# Patient Record
Sex: Female | Born: 1960 | Race: White | Hispanic: No | Marital: Married | Smoking: Never smoker
Health system: Southern US, Community
[De-identification: ages and names within clinical notes are randomized; demographics above are authoritative.]

## PROBLEM LIST (undated history)

## (undated) DIAGNOSIS — D0591 Unspecified type of carcinoma in situ of right breast: Secondary | ICD-10-CM

## (undated) DIAGNOSIS — T753XXA Motion sickness, initial encounter: Secondary | ICD-10-CM

## (undated) DIAGNOSIS — M1990 Unspecified osteoarthritis, unspecified site: Secondary | ICD-10-CM

## (undated) DIAGNOSIS — N200 Calculus of kidney: Secondary | ICD-10-CM

## (undated) DIAGNOSIS — U071 COVID-19: Secondary | ICD-10-CM

## (undated) DIAGNOSIS — C439 Malignant melanoma of skin, unspecified: Secondary | ICD-10-CM

## (undated) DIAGNOSIS — Z923 Personal history of irradiation: Secondary | ICD-10-CM

## (undated) DIAGNOSIS — C50919 Malignant neoplasm of unspecified site of unspecified female breast: Secondary | ICD-10-CM

## (undated) DIAGNOSIS — Z973 Presence of spectacles and contact lenses: Secondary | ICD-10-CM

## (undated) HISTORY — PX: BLADDER SUSPENSION: SHX72

## (undated) HISTORY — DX: Calculus of kidney: N20.0

## (undated) HISTORY — PX: TONSILLECTOMY: SUR1361

## (undated) HISTORY — DX: COVID-19: U07.1

## (undated) HISTORY — PX: NM PET DX MELANOMA: HXRAD61

## (undated) HISTORY — DX: Unspecified type of carcinoma in situ of right breast: D05.91

## (undated) HISTORY — DX: Malignant melanoma of skin, unspecified: C43.9

---

## 1992-04-24 HISTORY — PX: ABDOMINAL HYSTERECTOMY: SHX81

## 1999-04-25 HISTORY — PX: BREAST SURGERY: SHX581

## 1999-04-25 HISTORY — PX: BREAST BIOPSY: SHX20

## 2009-02-22 DIAGNOSIS — D0591 Unspecified type of carcinoma in situ of right breast: Secondary | ICD-10-CM

## 2009-02-22 HISTORY — DX: Unspecified type of carcinoma in situ of right breast: D05.91

## 2009-04-24 DIAGNOSIS — C50919 Malignant neoplasm of unspecified site of unspecified female breast: Secondary | ICD-10-CM

## 2009-04-24 HISTORY — PX: BREAST LUMPECTOMY: SHX2

## 2009-04-24 HISTORY — DX: Malignant neoplasm of unspecified site of unspecified female breast: C50.919

## 2009-04-24 HISTORY — PX: BREAST EXCISIONAL BIOPSY: SUR124

## 2015-02-22 ENCOUNTER — Ambulatory Visit (INDEPENDENT_AMBULATORY_CARE_PROVIDER_SITE_OTHER): Payer: PRIVATE HEALTH INSURANCE | Admitting: Family Medicine

## 2015-02-22 ENCOUNTER — Encounter: Payer: Self-pay | Admitting: Family Medicine

## 2015-02-22 VITALS — BP 120/72 | HR 71 | Resp 16 | Ht 68.0 in | Wt 183.2 lb

## 2015-02-22 DIAGNOSIS — Z0000 Encounter for general adult medical examination without abnormal findings: Secondary | ICD-10-CM

## 2015-02-22 DIAGNOSIS — R002 Palpitations: Secondary | ICD-10-CM | POA: Insufficient documentation

## 2015-02-22 DIAGNOSIS — Z853 Personal history of malignant neoplasm of breast: Secondary | ICD-10-CM | POA: Diagnosis not present

## 2015-02-22 DIAGNOSIS — Z7189 Other specified counseling: Secondary | ICD-10-CM | POA: Diagnosis not present

## 2015-02-22 DIAGNOSIS — M722 Plantar fascial fibromatosis: Secondary | ICD-10-CM | POA: Diagnosis not present

## 2015-02-22 DIAGNOSIS — Z23 Encounter for immunization: Secondary | ICD-10-CM | POA: Diagnosis not present

## 2015-02-22 DIAGNOSIS — Z7689 Persons encountering health services in other specified circumstances: Secondary | ICD-10-CM

## 2015-02-22 MED ORDER — MELOXICAM 15 MG PO TABS: 15.0000 mg | ORAL_TABLET | Freq: Every day | ORAL | Status: DC

## 2015-02-22 NOTE — Patient Instructions (Signed)
Ice R foot 3-4 times a day.  Stretch R foot before walking.

## 2015-02-22 NOTE — Progress Notes (Signed)
Name: Alyssa Bradford   MRN: [id number]    DOB: 1960/08/13   Date:02/22/2015       Progress Note  Subjective  Chief Complaint  Chief Complaint  Patient presents with  . Establish Care  . Foot Injury    heel spt on Left foot   . Palpitations    Feels like heart jumping x few months maybe every other month.     HPI  Here to establish care.  Has hx of R breast cancer .  Survivor x 6 yrs.  Surgeon retired and oncologist released her.  She has not had a mammogram in about 1 year.  No masses felt.  R. breast  Still sensitive sec. to radiation.    She c/o some palpitations at times.  Short duration.  Lasts 5-8 sec.  No syncope or near syncope.  Strong fm hx of card disease (father with 2 MIs and multiple angioplasties and  Bypass graft.).  Needs a colonoscopy.   No labs in 1 year.  Had bone density test 1 year ago.  Has painful place  place on her R heel.   No problem-specific assessment & plan notes found for this encounter.   Past Medical History  Diagnosis Date  . Cancer [facility])     Past Surgical History  Procedure Laterality Date  . Breast surgery  2001    BIOPSY  . Abdominal hysterectomy    . Tonsillectomy    . Breast lumpectomy  2011  . Nm pet dx melanoma      Family History  Problem Relation Age of Onset  . Cancer Father     BLADDER /SKIN   . Heart disease Father     Social History   Social History  . Marital Status: Married    Spouse Name: N/A  . Number of Children: N/A  . Years of Education: N/A   Occupational History  . Not on file.   Social History Main Topics  . Smoking status: Never Smoker   . Smokeless tobacco: Never Used  . Alcohol Use: No  . Drug Use: No  . Sexual Activity: Not on file   Other Topics Concern  . Not on file   Social History Narrative  . No narrative on file     Current outpatient prescriptions:  .  meloxicam (MOBIC) 15 MG tablet, Take 1 tablet (15 mg total) by mouth daily., Disp: 30 tablet, Rfl: 6  Allergies  Allergen  Reactions  . Latex      Review of Systems  Constitutional: Negative for fever, chills, weight loss and malaise/fatigue.  HENT: Negative for congestion, hearing loss and nosebleeds.   Eyes: Negative for blurred vision, double vision and pain.  Respiratory: Negative for cough, sputum production, shortness of breath and wheezing.   Cardiovascular: Positive for palpitations. Negative for chest pain and leg swelling.  Gastrointestinal: Negative for heartburn, abdominal pain, diarrhea, constipation and blood in stool.  Genitourinary: Negative for dysuria, urgency and frequency.  Musculoskeletal: Negative for myalgias and joint pain.       R heel pain  Skin: Negative for rash.  Neurological: Negative for dizziness, tremors, sensory change, focal weakness, weakness and headaches.  Psychiatric/Behavioral: Negative for depression. The patient is not nervous/anxious and does not have insomnia.       Objective  Filed Vitals:   02/22/15 1432  BP: 120/72  Pulse: 71  Resp: 16  Height: 5\' 8"  (1.727 m)  Weight: 183 lb 3.2 oz (83.099 kg)  Physical Exam  Constitutional: She is oriented to person, place, and time and well-developed, well-nourished, and in no distress. No distress.  HENT:  Head: Normocephalic and atraumatic.  Eyes: Conjunctivae and EOM are normal. Pupils are equal, round, and reactive to light. No scleral icterus.  Neck: Normal range of motion. Carotid bruit is not present. No thyromegaly present.  Cardiovascular: Normal rate, regular rhythm, normal heart sounds and intact distal pulses.  Exam reveals no gallop and no friction rub.   No murmur heard. Pulmonary/Chest: Effort normal and breath sounds normal. No respiratory distress. She has no wheezes. She has no rales.  Abdominal: Soft. Bowel sounds are normal. She exhibits no distension and no mass. There is no tenderness.  Musculoskeletal: She exhibits no edema.  Tender spot on R foot plantar surface.  No skin lesions   Lymphadenopathy:    She has no cervical adenopathy.  Neurological: She is alert and oriented to person, place, and time.  Vitals reviewed.      No results found for this or any previous visit (from the past 2160 hour(s)).   Assessment & Plan  Problem List Items Addressed This Visit      Other   History of breast cancer   Relevant Orders   MM Digital Diagnostic Bilat   Ambulatory referral to General Surgery   Preventative health care   Relevant Orders   Varicella-zoster vaccine subcutaneous (Completed)   Palpitation   Relevant Orders   EKG 12-Lead   Ambulatory referral to Cardiology   TSH   Encounter to establish care - Primary   Relevant Orders   Comprehensive Metabolic Panel (CMET)   CBC with Differential   Lipid Profile    Other Visit Diagnoses    Plantar fasciitis        Relevant Medications    meloxicam (MOBIC) 15 MG tablet       Meds ordered this encounter  Medications  . meloxicam (MOBIC) 15 MG tablet    Sig: Take 1 tablet (15 mg total) by mouth daily.    Dispense:  30 tablet    Refill:  6   1. Encounter to establish care  - Comprehensive Metabolic Panel (CMET) - CBC with Differential - Lipid Profile  2. History of breast cancer  - MM Digital Diagnostic Bilat; Future- December - Ambulatory referral to General Surgery- January 3. Preventative health care  - Varicella-zoster vaccine subcutaneous  4. Palpitation  - EKG 12-Lead - Ambulatory referral to Cardiology - TSH  5. Plantar fasciitis  - meloxicam (MOBIC) 15 MG tablet; Take 1 tablet (15 mg total) by mouth daily.  Dispense: 30 tablet; Refill: 6

## 2015-02-23 ENCOUNTER — Telehealth: Payer: Self-pay | Admitting: *Deleted

## 2015-02-23 NOTE — Telephone Encounter (Signed)
Patient cardiology appt is Apr 27, 2015 at 2:40 with Dr. Rockey Situ.

## 2015-02-25 LAB — CBC WITH DIFFERENTIAL/PLATELET
BASOS ABS: 0.1 10*3/uL (ref 0.0–0.2)
Basos: 1 %
EOS (ABSOLUTE): 0.2 10*3/uL (ref 0.0–0.4)
Eos: 4 %
HEMOGLOBIN: 12.9 g/dL (ref 11.1–15.9)
Hematocrit: 36.7 % (ref 34.0–46.6)
IMMATURE GRANS (ABS): 0 10*3/uL (ref 0.0–0.1)
Immature Granulocytes: 0 %
Lymphocytes Absolute: 1.2 10*3/uL (ref 0.7–3.1)
Lymphs: 30 %
MCH: 31 pg (ref 26.6–33.0)
MCHC: 35.1 g/dL (ref 31.5–35.7)
MCV: 88 fL (ref 79–97)
MONOCYTES: 13 %
Monocytes Absolute: 0.5 10*3/uL (ref 0.1–0.9)
Neutrophils Absolute: 2.2 10*3/uL (ref 1.4–7.0)
Neutrophils: 52 %
PLATELETS: 229 10*3/uL (ref 150–379)
RBC: 4.16 x10E6/uL (ref 3.77–5.28)
RDW: 12.8 % (ref 12.3–15.4)
WBC: 4.2 10*3/uL (ref 3.4–10.8)

## 2015-02-26 LAB — LIPID PANEL
CHOLESTEROL TOTAL: 178 mg/dL (ref 100–199)
Chol/HDL Ratio: 2.7 ratio units (ref 0.0–4.4)
HDL: 66 mg/dL (ref >39)
LDL Calculated: 100 mg/dL — ABNORMAL HIGH (ref 0–99)
TRIGLYCERIDES: 61 mg/dL (ref 0–149)
VLDL CHOLESTEROL CAL: 12 mg/dL (ref 5–40)

## 2015-02-26 LAB — COMPREHENSIVE METABOLIC PANEL
ALK PHOS: 57 IU/L (ref 39–117)
ALT: 8 IU/L (ref 0–32)
AST: 27 IU/L (ref 0–40)
Albumin/Globulin Ratio: 1.8 (ref 1.1–2.5)
Albumin: 4.2 g/dL (ref 3.5–5.5)
BILIRUBIN TOTAL: 0.3 mg/dL (ref 0.0–1.2)
BUN/Creatinine Ratio: 19 (ref 9–23)
BUN: 16 mg/dL (ref 6–24)
CHLORIDE: 101 mmol/L (ref 97–106)
CO2: 25 mmol/L (ref 18–29)
Calcium: 9.1 mg/dL (ref 8.7–10.2)
Creatinine, Ser: 0.83 mg/dL (ref 0.57–1.00)
GFR calc Af Amer: 92 mL/min/{1.73_m2} (ref >59)
GFR calc non Af Amer: 80 mL/min/{1.73_m2} (ref >59)
GLUCOSE: 86 mg/dL (ref 65–99)
Globulin, Total: 2.3 g/dL (ref 1.5–4.5)
Potassium: 4.3 mmol/L (ref 3.5–5.2)
Sodium: 142 mmol/L (ref 136–144)
TOTAL PROTEIN: 6.5 g/dL (ref 6.0–8.5)

## 2015-02-26 LAB — TSH: TSH: 2.87 u[IU]/mL (ref 0.450–4.500)

## 2015-03-01 ENCOUNTER — Telehealth: Payer: Self-pay | Admitting: *Deleted

## 2015-03-01 NOTE — Telephone Encounter (Signed)
Called patient and asked if she had scheduled her mammogram yet? Per patient she has to get film from [place] and this will be done at the end of month. Patient will call office and let us know when mammogram is scheduled and we will then proceed with surgical referral to Dr. Jamal Collin.

## 2015-04-27 ENCOUNTER — Encounter: Payer: Self-pay | Admitting: Cardiovascular Disease

## 2015-04-27 ENCOUNTER — Encounter (INDEPENDENT_AMBULATORY_CARE_PROVIDER_SITE_OTHER): Payer: Self-pay

## 2015-04-27 ENCOUNTER — Ambulatory Visit (INDEPENDENT_AMBULATORY_CARE_PROVIDER_SITE_OTHER): Payer: Managed Care, Other (non HMO) | Admitting: Cardiovascular Disease

## 2015-04-27 VITALS — BP 124/82 | HR 78 | Ht 68.0 in | Wt 183.0 lb

## 2015-04-27 DIAGNOSIS — Z0000 Encounter for general adult medical examination without abnormal findings: Secondary | ICD-10-CM | POA: Diagnosis not present

## 2015-04-27 DIAGNOSIS — Z853 Personal history of malignant neoplasm of breast: Secondary | ICD-10-CM | POA: Diagnosis not present

## 2015-04-27 DIAGNOSIS — R002 Palpitations: Secondary | ICD-10-CM | POA: Diagnosis not present

## 2015-04-27 NOTE — Progress Notes (Signed)
Patient ID: Alyssa Bradford, female    DOB: 1960/06/08, 55 y.o.   MRN: [id number]  HPI Comments: Alyssa Bradford is a very pleasant 55 year old woman with history of tachycardia, strong family history of coronary artery disease who presents for evaluation of her arrhythmia, patient of Dr. Luan Pulling History of breast cancer on the right with radiation  She reports for the past 3 years he has had one episode per month of racing in her chest, described as a tachycardia, lasting 20 seconds or so. Typically resolves by sitting, resting. Never lasted very long, no clear pattern, not associated with exertion. She does feel mildly short of breath, chest tightness, does appreciate the tachycardia. Feels it is very irregular in nature, not irregular. Unclear if this is associated with stress, does not seem like it is linked to anything.  In discussion of her father, father had MI in his 48s, CABG later in life He was a smoker  Patient is not a smoker, no diabetes Total cholesterol 178, LDL 100 She is trying to lose weight  EKG on today's visit shows normal sinus rhythm with rate 78 bpm, no significant ST or T-wave changes   Allergies  Allergen Reactions  . Latex     Current Outpatient Prescriptions on File Prior to Visit  Medication Sig Dispense Refill  . meloxicam (MOBIC) 15 MG tablet Take 1 tablet (15 mg total) by mouth daily. 30 tablet 6   No current facility-administered medications on file prior to visit.    Past Medical History  Diagnosis Date  . Cancer [facility])     Past Surgical History  Procedure Laterality Date  . Breast surgery  2001    BIOPSY  . Abdominal hysterectomy    . Tonsillectomy    . Breast lumpectomy  2011  . Nm pet dx melanoma      Social History  reports that she has never smoked. She has never used smokeless tobacco. She reports that she does not drink alcohol or use illicit drugs.  Family History family history includes Cancer in her father; Heart attack (age of onset:  56) in her father; Heart disease in her father.   Review of Systems  Constitutional: Negative.   HENT: Negative.   Eyes: Negative.   Respiratory: Negative.   Cardiovascular: Positive for palpitations.       Tachycardia  Gastrointestinal: Negative.   Endocrine: Negative.   Musculoskeletal: Negative.   Skin: Negative.   Allergic/Immunologic: Negative.   Neurological: Negative.   Hematological: Negative.   Psychiatric/Behavioral: Negative.   All other systems reviewed and are negative.   BP 124/82 mmHg  Pulse 78  Ht 5\' 8"  (1.727 m)  Wt 183 lb (83.008 kg)  BMI 27.83 kg/m2  Physical Exam  Constitutional: She is oriented to person, place, and time. She appears well-developed and well-nourished.  HENT:  Head: Normocephalic.  Nose: Nose normal.  Mouth/Throat: Oropharynx is clear and moist.  Eyes: Conjunctivae are normal. Pupils are equal, round, and reactive to light.  Neck: Normal range of motion. Neck supple. No JVD present.  Cardiovascular: Normal rate, regular rhythm, normal heart sounds and intact distal pulses.  Exam reveals no gallop and no friction rub.   No murmur heard. Pulmonary/Chest: Effort normal and breath sounds normal. No respiratory distress. She has no wheezes. She has no rales. She exhibits no tenderness.  Abdominal: Soft. Bowel sounds are normal. She exhibits no distension. There is no tenderness.  Musculoskeletal: Normal range of motion. She exhibits no edema or  tenderness.  Lymphadenopathy:    She has no cervical adenopathy.  Neurological: She is alert and oriented to person, place, and time. Coordination normal.  Skin: Skin is warm and dry. No rash noted. No erythema.  Psychiatric: She has a normal mood and affect. Her behavior is normal. Judgment and thought content normal.

## 2015-04-27 NOTE — Patient Instructions (Addendum)
You are doing well. No medication changes were made.  Please research CT coronary calcium score, looking for blockage  Research SVT (supraventricular tachycardia), rate 150 to 200 or more) Also read on atrial tachycardia (rate typically 100 to 140s)  Please call us if you have new issues that need to be addressed before your next appt.  Coronary Calcium Scan A coronary calcium scan is an imaging test used to look for deposits of calcium and other fatty materials (plaques) in the inner lining of the blood vessels of your heart (coronary arteries). These deposits of calcium and plaques can partly clog and narrow the coronary arteries without producing any symptoms or warning signs. This puts you at risk for a heart attack. This test can detect these deposits before symptoms develop.  LET [facility] CARE PROVIDER KNOW ABOUT:  Any allergies you have.  All medicines you are taking, including vitamins, herbs, eye drops, creams, and over-the-counter medicines.  Previous problems you or members of your family have had with the use of anesthetics.  Any blood disorders you have.  Previous surgeries you have had.  Medical conditions you have.  Possibility of pregnancy, if this applies. RISKS AND COMPLICATIONS Generally, this is a safe procedure. However, as with any procedure, complications can occur. This test involves the use of radiation. Radiation exposure can be dangerous to a pregnant woman and her unborn baby. If you are pregnant, you should not have this procedure done.  BEFORE THE PROCEDURE There is no special preparation for the procedure. PROCEDURE  You will need to undress and put on a hospital gown. You will need to remove any jewelry around your neck or chest.  Sticky electrodes are placed on your chest and are connected to an electrocardiogram (EKG or electrocardiography) machine to recorda tracing of the electrical activity of your heart.  A CT scanner will take pictures of  your heart. During this time, you will be asked to lie still and hold your breath for 2-3 seconds while a picture is being taken of your heart. AFTER THE PROCEDURE   You will be allowed to get dressed.  You can return to your normal activities after the scan is done.   This information is not intended to replace advice given to you by your health care provider. Make sure you discuss any questions you have with your health care provider.   Document Released: 10/07/2007 Document Revised: 04/15/2013 Document Reviewed: 12/16/2012 Elsevier Interactive Patient Education 2016 Elsevier Inc. Paroxysmal Supraventricular Tachycardia Paroxysmal supraventricular tachycardia (PSVT) is a type of abnormal heart rhythm. It causes your heart to beat very quickly and then suddenly stop beating so quickly. A normal heart rate is 60-100 beats per minute. During an episode of PSVT, your heart rate may be 150-250 beats per minute. This can make you feel light-headed and short of breath. An episode of PSVT can be frightening. It is usually not dangerous. The heart has four chambers. All chambers need to work together for the heart to beat effectively. A normal heartbeat usually starts in the right upper chamber of the heart (atrium) when an area (sinoatrial node) puts out an electrical signal that spreads to the other chambers. People with PSVT may have abnormal electrical pathways, or they may have other areas in the upper chambers that send out electrical signals. The result is a very rapid heartbeat. When your heart beats very quickly, it does not have time to fill completely with blood. When PSVT happens often or it lasts  for long periods, it can lead to heart weakness and failure. Most people with PSVT do not have any other heart disease. CAUSES Abnormal electrical activity in the heart causes PSVT. It is not known why some people get PSVT and others do not. RISK FACTORS You may be more likely to have PSVT  if:  You are 77-40 years old.  You are a woman. Other factors that may increase your chances of an attack include:  Stress.  Being tired.  Smoking.  Stimulant drugs.  Alcoholic drinks.  Caffeine.  Pregnancy. SIGNS AND SYMPTOMS A mild episode of PSVT may cause no symptoms. If you do have signs and symptoms, they may include:  A pounding heart.  Feeling of skipped heartbeats (palpitations).  Weakness.  Shortness of breath.  Tightness or pain in your chest.  Light-headedness.  Anxiety.  Dizziness.  Sweating.  Nausea.  A fainting spell. DIAGNOSIS Your health care provider may suspect PSVT if you have symptoms that come and go. The health care provider will do a physical exam. If you are having an episode during the exam, the health care provider may be able to diagnose PSVT by listening to your heart and feeling your pulse. Tests may also be done, including:  An electrical study of your heart (electrocardiogram, or ECG).  A test in which you wear a portable ECG monitor all day (Holter monitor) or for several days (event monitor).  A test that involves taking an image of your heart using sound waves (echocardiogram) to rule out other causes of a fast heart rate. TREATMENT You may not need treatment if episodes of PSVT do not happen often or if they do not cause symptoms. If PSVT episodes do cause symptoms, your health care provider may first suggest trying a self-treatment called vagus nerve stimulation. The vagus nerve extends down from the brain. It regulates certain body functions. Stimulating this nerve can slow down the heart. Your health care provider can teach you ways to do this. You may need to try a few ways to find what works best for you. Options include:  Holding your breath and pushing, as though you are having a bowel movement.  Massaging an area on one side of your neck below your jaw.  Bending forward with your head between your legs.  Bending  forward with your head between your legs and coughing.  Massaging your eyeballs with your eyes closed. If vagus nerve stimulation does not work, other treatment options include:  Medicines to prevent an attack.  Being treated in the hospital with medicine or electric shock to stop an attack (cardioversion). This treatment can include:  Getting medicine through an IV line.  Having a small electric shock delivered to your heart. You will be given medicine to make you sleep through this procedure.  If you have frequent episodes with symptoms, you may need a procedure to get rid of the faulty areas of your heart (radiofrequency ablation) and end the episodes of PSVT. In this procedure:  A long, thin tube (catheter) is passed through one of your veins into your heart.  Energy directed through the catheter eliminates the areas of your heart that are causing abnormal electric stimulation. HOME CARE INSTRUCTIONS  Take medicines only as directed by your health care provider.  Do not use caffeine in any form if caffeine triggers episodes of PSVT. Otherwise, consume caffeine in moderation. This means no more than a few cups of coffee or the equivalent each day.  Do not  drink alcohol if alcohol triggers episodes of PSVT. Otherwise, limit alcohol intake to no more than 1 drink per day for nonpregnant women and 2 drinks per day for men. One drink equals 12 ounces of beer, 5 ounces of wine, or 1 ounces of hard liquor.  Do not use any tobacco products, including cigarettes, chewing tobacco, or electronic cigarettes. If you need help quitting, ask your health care provider.  Try to get at least 7 hours of sleep each night.  Find healthy ways to manage stress.  Perform vagus nerve stimulation as directed by your health care provider.  Maintain a healthy weight.  Get some exercise on most days. Ask your health care provider to suggest some good activities for you. SEEK MEDICAL CARE IF:  You are  having episodes of PSVT more often, or they are lasting longer.  Vagus nerve stimulation is no longer helping.  You have new symptoms during an episode. SEEK IMMEDIATE MEDICAL CARE IF:  You have chest pain or trouble breathing.  You have an episode of PSVT that has lasted longer than 20 minutes.  You have passed out from an episode of PSVT. These symptoms may represent a serious problem that is an emergency. Do not wait to see if the symptoms will go away. Get medical help right away. Call your local emergency services (911 in the [place] Do not drive yourself to the hospital.   This information is not intended to replace advice given to you by your health care provider. Make sure you discuss any questions you have with your health care provider.   Document Released: 04/10/2005 Document Revised: 05/01/2014 Document Reviewed: 09/18/2013 Elsevier Interactive Patient Education 2016 Elsevier Inc. Nonspecific Tachycardia Tachycardia is a faster than normal heartbeat (more than 100 beats per minute). In adults, the heart normally beats between 60 and 100 times a minute. A fast heartbeat may be a normal response to exercise or stress. It does not necessarily mean that something is wrong. However, sometimes when your heart beats too fast it may not be able to pump enough blood to the rest of your body. This can result in chest pain, shortness of breath, dizziness, and even fainting. Nonspecific tachycardia means that the specific cause or pattern of your tachycardia is unknown. CAUSES  Tachycardia may be harmless or it may be due to a more serious underlying cause. Possible causes of tachycardia include:  Exercise or exertion.  Fever.  Pain or injury.  Infection.  Loss of body fluids (dehydration).  Overactive thyroid.  Lack of red blood cells (anemia).  Anxiety and stress.  Alcohol.  Caffeine.  Tobacco products.  Diet pills.  Illegal drugs.  Heart disease. SYMPTOMS  Rapid  or irregular heartbeat (palpitations).  Suddenly feeling your heart beating (cardiac awareness).  Dizziness.  Tiredness (fatigue).  Shortness of breath.  Chest pain.  Nausea.  Fainting. DIAGNOSIS  Your caregiver will perform a physical exam and take your medical history. In some cases, a heart specialist (cardiologist) may be consulted. Your caregiver may also order:  Blood tests.  Electrocardiography. This test records the electrical activity of your heart.  A heart monitoring test. TREATMENT  Treatment will depend on the likely cause of your tachycardia. The goal is to treat the underlying cause of your tachycardia. Treatment methods may include:  Replacement of fluids or blood through an intravenous (IV) tube for moderate to severe dehydration or anemia.  New medicines or changes in your current medicines.  Diet and lifestyle changes.  Treatment  for certain infections.  Stress relief or relaxation methods. HOME CARE INSTRUCTIONS   Rest.  Drink enough fluids to keep your urine clear or pale yellow.  Do not smoke.  Avoid:  Caffeine.  Tobacco.  Alcohol.  Chocolate.  Stimulants such as over-the-counter diet pills or pills that help you stay awake.  Situations that cause anxiety or stress.  Illegal drugs such as marijuana, phencyclidine (PCP), and cocaine.  Only take medicine as directed by your caregiver.  Keep all follow-up appointments as directed by your caregiver. SEEK IMMEDIATE MEDICAL CARE IF:   You have pain in your chest, upper arms, jaw, or neck.  You become weak, dizzy, or feel faint.  You have palpitations that will not go away.  You vomit, have diarrhea, or pass blood in your stool.  Your skin is cool, pale, and wet.  You have a fever that will not go away with rest, fluids, and medicine. MAKE SURE YOU:   Understand these instructions.  Will watch your condition.  Will get help right away if you are not doing well or get  worse.   This information is not intended to replace advice given to you by your health care provider. Make sure you discuss any questions you have with your health care provider.   Document Released: 05/18/2004 Document Revised: 07/03/2011 Document Reviewed: 10/23/2014 Elsevier Interactive Patient Education [employer].

## 2015-04-27 NOTE — Assessment & Plan Note (Signed)
Breast cancer on the right, radiation on the right

## 2015-04-27 NOTE — Assessment & Plan Note (Addendum)
Low cholesterol, nonsmoker, nondiabetic We suggested if she is concerned about underlying coronary artery disease, CT coronary calcium score could be ordered Imaging study discussed with her, information provided

## 2015-04-27 NOTE — Assessment & Plan Note (Addendum)
I suspect she is either having runs of atrial tachycardia, even SVT She describes a very rapid rhythm lasting for 20 seconds or so at a time She does not want medications on daily basis, does not want medications to take even as needed (these would likely be difficult to manage as symptoms are so short-lived). She prefers to watch her symptoms for now. EKG and clinical exam is essentially benign. Less likely atrial fibrillation or flutter.  Recommended if symptoms get worse, more frequent, a 30 day monitor would be ordered. Details of the monitor discussed with her She would like to hold off for now

## 2015-05-05 ENCOUNTER — Other Ambulatory Visit: Payer: Self-pay | Admitting: *Deleted

## 2015-05-05 ENCOUNTER — Other Ambulatory Visit: Payer: Self-pay | Admitting: Family Medicine

## 2015-05-05 ENCOUNTER — Inpatient Hospital Stay
Admission: RE | Admit: 2015-05-05 | Discharge: 2015-05-05 | Disposition: A | Discharge status: Home / Self Care | Payer: Self-pay | Source: Ambulatory Visit | Attending: *Deleted | Admitting: *Deleted

## 2015-05-05 DIAGNOSIS — Z9289 Personal history of other medical treatment: Secondary | ICD-10-CM

## 2015-05-10 ENCOUNTER — Telehealth: Payer: Self-pay

## 2015-05-10 ENCOUNTER — Other Ambulatory Visit: Payer: Self-pay

## 2015-05-10 ENCOUNTER — Encounter: Payer: Self-pay | Admitting: *Deleted

## 2015-05-10 DIAGNOSIS — Z853 Personal history of malignant neoplasm of breast: Secondary | ICD-10-CM

## 2015-05-10 DIAGNOSIS — Z1211 Encounter for screening for malignant neoplasm of colon: Secondary | ICD-10-CM

## 2015-05-10 NOTE — Telephone Encounter (Signed)
Patient has films of other Mammo. HX of Right Breast Cancer. I ordered Diagnostic and released the on e you ordered after I saw it so there may be double order. She mentioned seeing Sankar for breast exams and colonoscopy. Do I refer for consult or Colonoscopy? [facility]

## 2015-05-10 NOTE — Telephone Encounter (Signed)
Ordered ref to [place]. They will contact her. [facility]

## 2015-05-10 NOTE — Telephone Encounter (Signed)
If she has not had a colonoscopy yet,, yes Sank could see he about both.-jh

## 2015-05-13 ENCOUNTER — Other Ambulatory Visit: Payer: Self-pay | Admitting: *Deleted

## 2015-05-13 ENCOUNTER — Other Ambulatory Visit: Payer: Self-pay | Admitting: Family Medicine

## 2015-05-13 ENCOUNTER — Telehealth: Payer: Self-pay | Admitting: Family Medicine

## 2015-05-13 DIAGNOSIS — Z853 Personal history of malignant neoplasm of breast: Secondary | ICD-10-CM

## 2015-05-13 DIAGNOSIS — Z1239 Encounter for other screening for malignant neoplasm of breast: Secondary | ICD-10-CM

## 2015-05-13 NOTE — Telephone Encounter (Signed)
Pt called to schedule mammo but they told her the paper work was incomplete.  She would like to have it done before appt on Feb 1st  Please call [phone]

## 2015-05-13 NOTE — Telephone Encounter (Signed)
Entered [place] left/right breast due to hx of breast cancer. Notified patient that she could call back to schedule appt.

## 2015-05-26 ENCOUNTER — Ambulatory Visit: Payer: Self-pay | Admitting: General Surgery

## 2015-05-27 ENCOUNTER — Ambulatory Visit: Admission: RE | Admit: 2015-05-27 | Payer: Managed Care, Other (non HMO) | Source: Ambulatory Visit

## 2015-05-27 ENCOUNTER — Ambulatory Visit
Admission: RE | Admit: 2015-05-27 | Discharge: 2015-05-27 | Disposition: A | Discharge status: Home / Self Care | Payer: Managed Care, Other (non HMO) | Source: Ambulatory Visit | Attending: Family Medicine | Admitting: Family Medicine

## 2015-05-27 ENCOUNTER — Other Ambulatory Visit: Payer: Self-pay | Admitting: Family Medicine

## 2015-05-27 DIAGNOSIS — Z853 Personal history of malignant neoplasm of breast: Secondary | ICD-10-CM

## 2015-05-27 HISTORY — DX: Malignant neoplasm of unspecified site of unspecified female breast: C50.919

## 2015-06-07 ENCOUNTER — Encounter: Payer: Self-pay | Admitting: General Surgery

## 2015-06-07 ENCOUNTER — Ambulatory Visit (INDEPENDENT_AMBULATORY_CARE_PROVIDER_SITE_OTHER): Payer: Managed Care, Other (non HMO) | Admitting: General Surgery

## 2015-06-07 VITALS — BP 120/74 | HR 76 | Resp 12 | Ht 68.0 in | Wt 183.0 lb

## 2015-06-07 DIAGNOSIS — Z1211 Encounter for screening for malignant neoplasm of colon: Secondary | ICD-10-CM | POA: Diagnosis not present

## 2015-06-07 DIAGNOSIS — Z853 Personal history of malignant neoplasm of breast: Secondary | ICD-10-CM | POA: Diagnosis not present

## 2015-06-07 MED ORDER — POLYETHYLENE GLYCOL 3350 17 GM/SCOOP PO POWD: ORAL | Status: DC

## 2015-06-07 NOTE — Progress Notes (Signed)
Patient ID: Alyssa Bradford, female   DOB: Aug 01, 1960, 55 y.o.   MRN: [id number]  Chief Complaint  Patient presents with  . Other    Screening colonoscopy and mammogram    HPI Alyssa Bradford is a 55 y.o. female.  Female here today for evaluation for screening colonoscopy.  None prior. No GI problems at this time.  Presents also for a breast evaluation. The most recent mammogram was done on 05-27-15.  Patient does perform regular self breast checks and gets regular mammograms done.  No family history of colon cancer, but family history of breast cancer.  Patient has  history of breast cancer-2011, had lumpectomy and radiation. I have reviewed the history of present illness with the patient.  HPI  Past Medical History  Diagnosis Date  . Cancer ([place])   . Breast cancer [facility]) 2011    right breast with lumpectomy and rad tx    Past Surgical History  Procedure Laterality Date  . Breast surgery  2001    BIOPSY  . Abdominal hysterectomy    . Tonsillectomy    . Breast lumpectomy  2011  . Nm pet dx melanoma    . Breast biopsy Left 2001    benign    Family History  Problem Relation Age of Onset  . Cancer Father     BLADDER /SKIN   . Heart disease Father     Stent placement & CABG x 3  . Heart attack Father 18  . Breast cancer Maternal Grandmother     70's  . Breast cancer Paternal Grandmother     60's    Social History Social History  Substance Use Topics  . Smoking status: Never Smoker   . Smokeless tobacco: Never Used  . Alcohol Use: No    Allergies  Allergen Reactions  . Latex     Current Outpatient Prescriptions  Medication Sig Dispense Refill  . meloxicam (MOBIC) 15 MG tablet Take 1 tablet (15 mg total) by mouth daily. 30 tablet 6  . polyethylene glycol powder (GLYCOLAX/MIRALAX) powder 255 grams one bottle for colonoscopy prep 255 g 0   No current facility-administered medications for this visit.    Review of Systems Review of Systems  Constitutional: Negative.    Respiratory: Negative.   Cardiovascular: Negative.   Gastrointestinal: Negative.     Blood pressure 120/74, pulse 76, resp. rate 12, height 5\' 8"  (1.727 m), weight 183 lb (83.008 kg).  Physical Exam Physical Exam  Constitutional: She is oriented to person, place, and time. She appears well-developed and well-nourished.  Eyes: Conjunctivae are normal. No scleral icterus.  Neck: Neck supple.  Cardiovascular: Normal rate, regular rhythm and normal heart sounds.   Pulmonary/Chest: Effort normal and breath sounds normal. Right breast exhibits no inverted nipple, no mass, no nipple discharge, no skin change and no tenderness. Left breast exhibits no inverted nipple, no mass, no nipple discharge, no skin change and no tenderness.    Abdominal: Soft. Bowel sounds are normal. There is no hepatomegaly. There is no tenderness. No hernia.  Lymphadenopathy:    She has no cervical adenopathy.    She has no axillary adenopathy.  Neurological: She is alert and oriented to person, place, and time.  Skin: Skin is warm and dry.    Data Reviewed Note and mammogram reviewed- stable.  Assessment  History of right breast cancer- need additional info on this. 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 She was treated with antihormonal therapy for  4-5 yrs.  Needs Screening colonoscopy  Plan    Colonoscopy with possible biopsy/polypectomy prn: Information regarding the procedure, including its potential risks and complications (including but not limited to perforation of the bowel, which may require emergency surgery to repair, and bleeding) was verbally given to the patient. Educational information regarding lower intestinal endoscopy was given to the patient. Written instructions for how to complete the bowel prep using Miralax were provided. The importance of drinking ample fluids to avoid dehydration as a result of the prep emphasized.    To have a mammogram in a year and be seen  afterwards.   Patient has been scheduled for a colonoscopy on 07-21-15 at [facility].     PCP:  Luan Pulling  This information has been scribed by Sebastian Ache.     Rubel Heckard G 06/07/2015, 2:46 PM

## 2015-06-07 NOTE — Patient Instructions (Addendum)
Colonoscopy A colonoscopy is an exam to look at the entire large intestine (colon). This exam can help find problems such as tumors, polyps, inflammation, and areas of bleeding. The exam takes about 1 hour.  LET [facility] CARE PROVIDER KNOW ABOUT:   Any allergies you have.  All medicines you are taking, including vitamins, herbs, eye drops, creams, and over-the-counter medicines.  Previous problems you or members of your family have had with the use of anesthetics.  Any blood disorders you have.  Previous surgeries you have had.  Medical conditions you have. RISKS AND COMPLICATIONS  Generally, this is a safe procedure. However, as with any procedure, complications can occur. Possible complications include:  Bleeding.  Tearing or rupture of the colon wall.  Reaction to medicines given during the exam.  Infection (rare). BEFORE THE PROCEDURE   Ask your health care provider about changing or stopping your regular medicines.  You may be prescribed an oral bowel prep. This involves drinking a large amount of medicated liquid, starting the day before your procedure. The liquid will cause you to have multiple loose stools until your stool is almost clear or light green. This cleans out your colon in preparation for the procedure.  Do not eat or drink anything else once you have started the bowel prep, unless your health care provider tells you it is safe to do so.  Arrange for someone to drive you home after the procedure. PROCEDURE   You will be given medicine to help you relax (sedative).  You will lie on your side with your knees bent.  A long, flexible tube with a light and camera on the end (colonoscope) will be inserted through the rectum and into the colon. The camera sends video back to a computer screen as it moves through the colon. The colonoscope also releases carbon dioxide gas to inflate the colon. This helps your health care provider see the area better.  During  the exam, your health care provider may take a small tissue sample (biopsy) to be examined under a microscope if any abnormalities are found.  The exam is finished when the entire colon has been viewed. AFTER THE PROCEDURE   Do not drive for 24 hours after the exam.  You may have a small amount of blood in your stool.  You may pass moderate amounts of gas and have mild abdominal cramping or bloating. This is caused by the gas used to inflate your colon during the exam.  Ask when your test results will be ready and how you will get your results. Make sure you get your test results.   This information is not intended to replace advice given to you by your health care provider. Make sure you discuss any questions you have with your health care provider.   Document Released: 04/07/2000 Document Revised: 01/29/2013 Document Reviewed: 12/16/2012 Elsevier Interactive Patient Education [employer].  Patient has been scheduled for a colonoscopy on 07-21-15 at [facility].

## 2015-07-16 ENCOUNTER — Other Ambulatory Visit: Payer: Self-pay | Admitting: General Surgery

## 2015-07-16 DIAGNOSIS — Z1211 Encounter for screening for malignant neoplasm of colon: Secondary | ICD-10-CM

## 2015-07-21 ENCOUNTER — Ambulatory Visit: Payer: Managed Care, Other (non HMO) | Admitting: Anesthesiology

## 2015-07-21 ENCOUNTER — Encounter: Payer: Self-pay | Admitting: Anesthesiology

## 2015-07-21 ENCOUNTER — Encounter
Admission: RE | Disposition: A | Discharge status: Home / Self Care | Payer: Self-pay | Source: Ambulatory Visit | Attending: General Surgery

## 2015-07-21 ENCOUNTER — Ambulatory Visit
Admission: RE | Admit: 2015-07-21 | Discharge: 2015-07-21 | Disposition: A | Discharge status: Home / Self Care | Payer: Managed Care, Other (non HMO) | Source: Ambulatory Visit | Attending: General Surgery | Admitting: General Surgery

## 2015-07-21 DIAGNOSIS — Z853 Personal history of malignant neoplasm of breast: Secondary | ICD-10-CM | POA: Insufficient documentation

## 2015-07-21 DIAGNOSIS — Z91040 Latex allergy status: Secondary | ICD-10-CM | POA: Diagnosis not present

## 2015-07-21 DIAGNOSIS — Z1211 Encounter for screening for malignant neoplasm of colon: Secondary | ICD-10-CM | POA: Diagnosis not present

## 2015-07-21 HISTORY — PX: COLONOSCOPY WITH PROPOFOL: SHX5780

## 2015-07-21 SURGERY — COLONOSCOPY WITH PROPOFOL
Anesthesia: General

## 2015-07-21 MED ORDER — KETAMINE HCL 50 MG/ML IJ SOLN
INTRAMUSCULAR | Status: DC | PRN
  Administered 2015-07-21: 20 mg via INTRAMUSCULAR

## 2015-07-21 MED ORDER — PROPOFOL 500 MG/50ML IV EMUL
INTRAVENOUS | Status: DC | PRN
  Administered 2015-07-21: 125 ug/kg/min via INTRAVENOUS

## 2015-07-21 MED ORDER — SODIUM CHLORIDE 0.9 % IV SOLN
INTRAVENOUS | Status: DC
  Administered 2015-07-21: 08:00:00 via INTRAVENOUS

## 2015-07-21 MED ORDER — PROPOFOL 10 MG/ML IV BOLUS
INTRAVENOUS | Status: DC | PRN
  Administered 2015-07-21: 70 mg via INTRAVENOUS

## 2015-07-21 MED ORDER — LIDOCAINE HCL (PF) 2 % IJ SOLN
INTRAMUSCULAR | Status: DC | PRN
  Administered 2015-07-21: 80 mg via INTRADERMAL

## 2015-07-21 NOTE — Anesthesia Postprocedure Evaluation (Signed)
Anesthesia Post Note  Patient: Alyssa Bradford  Procedure(s) Performed: Procedure(s) (LRB): COLONOSCOPY WITH PROPOFOL (N/A)  Patient location during evaluation: Endoscopy Anesthesia Type: General Level of consciousness: awake and alert Pain management: pain level controlled Vital Signs Assessment: post-procedure vital signs reviewed and stable Respiratory status: spontaneous breathing, nonlabored ventilation, respiratory function stable and patient connected to nasal cannula oxygen Cardiovascular status: blood pressure returned to baseline and stable Postop Assessment: no signs of nausea or vomiting Anesthetic complications: no    Last Vitals:  Filed Vitals:   07/21/15 0930 07/21/15 0931  BP: 118/65   Pulse: 64 63  Temp:    Resp: 15 17    Last Pain: There were no vitals filed for this visit.               Precious Haws [place]

## 2015-07-21 NOTE — Op Note (Signed)
[facility] Gastroenterology Patient Name: Alyssa Bradford Procedure Date: 07/21/2015 8:07 AM MRN: [id number] Account #: [id number] Date of Birth: May 04, 1960 Admit Type: Outpatient Age: 55 Room: [facility] ENDO ROOM 1 Gender: Female Note Status: Finalized Procedure:            Colonoscopy Indications:          Screening for colorectal malignant neoplasm Providers:            Seeplaputhur G. Jamal Collin, MD Referring MD:         Arlis Porta, MD (Referring MD) Medicines:            General Anesthesia Complications:        No immediate complications. Procedure:            Pre-Anesthesia Assessment:                       - General anesthesia under the supervision of an                        anesthesiologist was determined to be medically                        necessary for this procedure based on review of the                        patient's medical history, medications, and prior                        anesthesia history.                       After obtaining informed consent, the colonoscope was                        passed under direct vision. Throughout the procedure,                        the patient's blood pressure, pulse, and oxygen                        saturations were monitored continuously. The                        Colonoscope was introduced through the anus and                        advanced to the the cecum, identified by the ileocecal                        valve. The colonoscopy was performed without                        difficulty. The patient tolerated the procedure well.                        The quality of the bowel preparation was adequate to                        identify polyps 6 mm and larger in size. Findings:      The perianal and digital rectal examinations were normal.  The entire examined colon appeared normal on direct and retroflexion       views. Impression:           - The entire examined colon is normal on direct and                   retroflexion views.                       - No specimens collected. Recommendation:       - Discharge patient to home.                       - Repeat colonoscopy in 10 years for screening purposes. Procedure Code(s):    --- Professional ---                       [phone], Colonoscopy, flexible; diagnostic, including                        collection of specimen(s) by brushing or washing, when                        performed (separate procedure) Diagnosis Code(s):    --- Professional ---                       Z12.11, Encounter for screening for malignant neoplasm                        of colon CPT copyright 2016 American Medical Association. All rights reserved. The codes documented in this report are preliminary and upon coder review may  be revised to meet current compliance requirements. Christene Lye, MD 07/21/2015 8:55:12 AM This report has been signed electronically. Number of Addenda: 0 Note Initiated On: 07/21/2015 8:07 AM Scope Withdrawal Time: 0 hours 6 minutes 1 second  Total Procedure Duration: 0 hours 31 minutes 3 seconds       [facility]

## 2015-07-21 NOTE — Anesthesia Preprocedure Evaluation (Signed)
Anesthesia Evaluation  Patient identified by MRN, date of birth, ID band Patient awake    Reviewed: Allergy & Precautions, H&P , NPO status , Patient's Chart, lab work & pertinent test results  History of Anesthesia Complications Negative for: history of anesthetic complications  Airway Mallampati: II  TM Distance: >3 FB Neck ROM: full    Dental no notable dental hx.    Pulmonary neg pulmonary ROS, neg shortness of breath,    Pulmonary exam normal breath sounds clear to auscultation       Cardiovascular Exercise Tolerance: Good (-) angina(-) Past MI negative cardio ROS Normal cardiovascular exam Rhythm:regular Rate:Normal     Neuro/Psych negative neurological ROS  negative psych ROS   GI/Hepatic negative GI ROS, Neg liver ROS, neg GERD  ,  Endo/Other  negative endocrine ROS  Renal/GU negative Renal ROS  negative genitourinary   Musculoskeletal   Abdominal   Peds  Hematology negative hematology ROS (+)   Anesthesia Other Findings Past Medical History:   Cancer ([place])                                                 Breast cancer ([place])                             2011           Comment:right breast with lumpectomy and rad tx  Past Surgical History:   BREAST SURGERY                                   2001           Comment:BIOPSY   ABDOMINAL HYSTERECTOMY                                        TONSILLECTOMY                                                 BREAST LUMPECTOMY                                2011         NM PET DX MELANOMA                                            BREAST BIOPSY                                   Left 2001           Comment:benign     Reproductive/Obstetrics negative OB ROS                             Anesthesia Physical Anesthesia Plan  ASA: II  Anesthesia Plan: General   Post-op Pain Management:    Induction:   Airway Management Planned:    Additional Equipment:   Intra-op Plan:   Post-operative Plan:   Informed Consent: I have reviewed the patients History and Physical, chart, labs and discussed the procedure including the risks, benefits and alternatives for the proposed anesthesia with the patient or authorized representative who has indicated his/her understanding and acceptance.   Dental Advisory Given  Plan Discussed with: Anesthesiologist, CRNA and Surgeon  Anesthesia Plan Comments:         Anesthesia Quick Evaluation

## 2015-07-21 NOTE — Transfer of Care (Signed)
Immediate Anesthesia Transfer of Care Note  Patient: Jackalyn Dumlao  Procedure(s) Performed: Procedure(s): COLONOSCOPY WITH PROPOFOL (N/A)  Patient Location: PACU  Anesthesia Type:General  Level of Consciousness: sedated and responds to stimulation  Airway & Oxygen Therapy: Patient Spontanous Breathing and Patient connected to nasal cannula oxygen  Post-op Assessment: Report given to RN and Post -op Vital signs reviewed and stable  Post vital signs: Reviewed and stable  Last Vitals:  Filed Vitals:   07/21/15 0803  BP: 120/73  Pulse: 74  Temp: 36.4 C  Resp: 17    Complications: No apparent anesthesia complications

## 2015-07-21 NOTE — H&P (Signed)
Alyssa Bradford is an 55 y.o. female.   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 Has not had a colonoscopy before. No GI problems noted  Past Medical History  Diagnosis Date  . Cancer ([place])   . Breast cancer [facility]) 2011    right breast with lumpectomy and rad tx    Past Surgical History  Procedure Laterality Date  . Breast surgery  2001    BIOPSY  . Abdominal hysterectomy    . Tonsillectomy    . Breast lumpectomy  2011  . Nm pet dx melanoma    . Breast biopsy Left 2001    benign    Family History  Problem Relation Age of Onset  . Cancer Father     BLADDER /SKIN   . Heart disease Father     Stent placement & CABG x 3  . Heart attack Father 62  . Breast cancer Maternal Grandmother     70's  . Breast cancer Paternal Grandmother     60's   Social History:  reports that she has never smoked. She has never used smokeless tobacco. She reports that she does not drink alcohol or use illicit drugs.  Allergies:  Allergies  Allergen Reactions  . Latex     Medications Prior to Admission  Medication Sig Dispense Refill  . meloxicam (MOBIC) 15 MG tablet Take 1 tablet (15 mg total) by mouth daily. 30 tablet 6  . polyethylene glycol powder (GLYCOLAX/MIRALAX) powder 255 grams one bottle for colonoscopy prep 255 g 0    No results found for this or any previous visit (from the past 61 hour(s)). No results found.  Review of Systems  Constitutional: Negative.   Respiratory: Negative.   Cardiovascular: Negative.   Gastrointestinal: Negative.   Genitourinary: Negative.   Musculoskeletal: Negative.     There were no vitals taken for this visit. Physical Exam  Constitutional: She is oriented to person, place, and time. She appears well-developed and well-nourished.  Eyes: Conjunctivae are normal. No scleral icterus.  Neck: Neck supple.  Cardiovascular: Normal rate, regular rhythm and normal heart sounds.   Respiratory:  Effort normal and breath sounds normal.  GI: Soft. Bowel sounds are normal. She exhibits no distension and no mass. There is no tenderness.  Neurological: She is alert and oriented to person, place, and time.  Skin: Skin is warm and intact.     Assessment/Plan Pt in nee of screening colonoscopy. This has been discussed fully with pt on 06/07/15 on her office visit.   Christene Lye, MD 07/21/2015, 7:49 AM

## 2015-07-23 ENCOUNTER — Encounter: Payer: Self-pay | Admitting: General Surgery

## 2015-08-24 ENCOUNTER — Ambulatory Visit: Payer: PRIVATE HEALTH INSURANCE | Admitting: Family Medicine

## 2015-11-18 ENCOUNTER — Other Ambulatory Visit: Payer: Self-pay | Admitting: Family Medicine

## 2015-11-18 DIAGNOSIS — M722 Plantar fascial fibromatosis: Secondary | ICD-10-CM

## 2015-12-23 ENCOUNTER — Other Ambulatory Visit: Payer: Self-pay | Admitting: Family Medicine

## 2015-12-23 DIAGNOSIS — M722 Plantar fascial fibromatosis: Secondary | ICD-10-CM

## 2016-01-27 ENCOUNTER — Other Ambulatory Visit: Payer: Self-pay | Admitting: Family Medicine

## 2016-01-27 DIAGNOSIS — M722 Plantar fascial fibromatosis: Secondary | ICD-10-CM

## 2016-02-24 ENCOUNTER — Other Ambulatory Visit: Payer: Self-pay | Admitting: Family Medicine

## 2016-02-24 DIAGNOSIS — M722 Plantar fascial fibromatosis: Secondary | ICD-10-CM

## 2016-03-25 ENCOUNTER — Other Ambulatory Visit: Payer: Self-pay | Admitting: Family Medicine

## 2016-03-25 DIAGNOSIS — M722 Plantar fascial fibromatosis: Secondary | ICD-10-CM

## 2016-03-31 ENCOUNTER — Other Ambulatory Visit: Payer: Self-pay

## 2016-03-31 DIAGNOSIS — D0511 Intraductal carcinoma in situ of right breast: Secondary | ICD-10-CM

## 2016-04-22 ENCOUNTER — Other Ambulatory Visit: Payer: Self-pay | Admitting: Family Medicine

## 2016-04-22 DIAGNOSIS — M722 Plantar fascial fibromatosis: Secondary | ICD-10-CM

## 2016-06-01 ENCOUNTER — Ambulatory Visit
Admission: RE | Admit: 2016-06-01 | Discharge: 2016-06-01 | Disposition: A | Discharge status: Home / Self Care | Payer: Managed Care, Other (non HMO) | Source: Ambulatory Visit | Attending: General Surgery | Admitting: General Surgery

## 2016-06-01 DIAGNOSIS — D0511 Intraductal carcinoma in situ of right breast: Secondary | ICD-10-CM

## 2016-06-06 ENCOUNTER — Encounter: Payer: Self-pay | Admitting: *Deleted

## 2016-06-08 ENCOUNTER — Encounter: Payer: Self-pay | Admitting: General Surgery

## 2016-06-08 ENCOUNTER — Ambulatory Visit (INDEPENDENT_AMBULATORY_CARE_PROVIDER_SITE_OTHER): Payer: Managed Care, Other (non HMO) | Admitting: General Surgery

## 2016-06-08 VITALS — BP 130/80 | HR 86 | Resp 12 | Ht 68.0 in | Wt 182.0 lb

## 2016-06-08 DIAGNOSIS — Z86000 Personal history of in-situ neoplasm of breast: Secondary | ICD-10-CM | POA: Diagnosis not present

## 2016-06-08 NOTE — Patient Instructions (Addendum)
The patient is aware to call back for any questions or concerns.  Patient will be asked to return to the office in one year with a bilateral screening mammogram, with Dr. Bary Castilla

## 2016-06-08 NOTE — Progress Notes (Signed)
Patient ID: Alyssa Bradford, female   DOB: [birth year], 56 y.o.   MRN: [id number]  Chief Complaint  Patient presents with  . Follow-up    HPI Alyssa Bradford is a 56 y.o. female.  who presents for her follow up right breast cancer follow up. The most recent mammogram was done on 06-01-16.  Patient does perform regular self breast checks and gets regular mammograms done.  No new breast issues. Screening colonoscopy completed last year - normal  I have reviewed the history of present illness with the patient.    HPI  Past Medical History:  Diagnosis Date  . Breast cancer [facility]) 2011   right breast with lumpectomy and rad tx  . Cancer [facility])     Past Surgical History:  Procedure Laterality Date  . ABDOMINAL HYSTERECTOMY    . BREAST BIOPSY Left 2001   benign  . BREAST EXCISIONAL BIOPSY Right 2011   + with rad tx   . BREAST LUMPECTOMY  2011  . BREAST SURGERY  2001   BIOPSY  . COLONOSCOPY WITH PROPOFOL N/A 07/21/2015   Procedure: COLONOSCOPY WITH PROPOFOL;  Surgeon: Christene Lye, MD;  Location: [facility] ENDOSCOPY;  Service: Endoscopy;  Laterality: N/A;  . NM PET DX MELANOMA    . TONSILLECTOMY      Family History  Problem Relation Age of Onset  . Cancer Father     BLADDER /SKIN   . Heart disease Father     Stent placement & CABG x 3  . Heart attack Father 34  . Breast cancer Maternal Grandmother     70's  . Breast cancer Paternal Grandmother     60's    Social History Social History  Substance Use Topics  . Smoking status: Never Smoker  . Smokeless tobacco: Never Used  . Alcohol use No    Allergies  Allergen Reactions  . Latex     Current Outpatient Prescriptions  Medication Sig Dispense Refill  . meloxicam (MOBIC) 15 MG tablet TAKE 1 TABLET BY MOUTH DAILY 30 tablet 2   No current facility-administered medications for this visit.     Review of Systems Review of Systems  Constitutional: Negative.   Respiratory: Negative.   Cardiovascular: Negative.     Blood  pressure 130/80, pulse 86, resp. rate 12, height 5\' 8"  (1.727 m), weight 182 lb (82.6 kg).  Physical Exam Physical Exam  Constitutional: She is oriented to person, place, and time. She appears well-developed and well-nourished.  HENT:  Mouth/Throat: Oropharynx is clear and moist. No oropharyngeal exudate.  Eyes: Conjunctivae are normal. No scleral icterus.  Neck: Neck supple.  Cardiovascular: Normal rate, regular rhythm and normal heart sounds.   Pulmonary/Chest: Effort normal and breath sounds normal. Right breast exhibits no inverted nipple, no mass, no nipple discharge, no skin change and no tenderness. Left breast exhibits no inverted nipple, no mass, no nipple discharge, no skin change and no tenderness.  Lumpectomy site well healed right breast.   Abdominal: Soft. Bowel sounds are normal.  Lymphadenopathy:    She has no cervical adenopathy.  Neurological: She is alert and oriented to person, place, and time.  Skin: Skin is warm and dry.  Psychiatric: Her behavior is normal.    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    Plan    Patient will be asked to return to the office in one year with a bilateral  screening mammogram, with Dr. Bary Castilla       This information has been scribed by Karie Fetch RN, BSN,BC.  Sharlene Mccluskey G 06/08/2016, 9:31 AM

## 2016-08-09 ENCOUNTER — Other Ambulatory Visit: Payer: Self-pay | Admitting: Family Medicine

## 2016-08-09 DIAGNOSIS — M722 Plantar fascial fibromatosis: Secondary | ICD-10-CM

## 2016-10-09 ENCOUNTER — Encounter: Payer: Self-pay | Admitting: Family Medicine

## 2016-10-09 ENCOUNTER — Ambulatory Visit (INDEPENDENT_AMBULATORY_CARE_PROVIDER_SITE_OTHER): Payer: 59 | Admitting: Family Medicine

## 2016-10-09 VITALS — BP 127/62 | HR 80 | Temp 98.7°F | Ht 68.0 in | Wt 180.0 lb

## 2016-10-09 DIAGNOSIS — J0110 Acute frontal sinusitis, unspecified: Secondary | ICD-10-CM

## 2016-10-09 DIAGNOSIS — J029 Acute pharyngitis, unspecified: Secondary | ICD-10-CM

## 2016-10-09 LAB — POCT RAPID STREP A (OFFICE): Rapid Strep A Screen: NEGATIVE

## 2016-10-09 MED ORDER — AMOXICILLIN-POT CLAVULANATE 875-125 MG PO TABS: 1.0000 | ORAL_TABLET | Freq: Two times a day (BID) | ORAL | 0 refills | Status: DC

## 2016-10-09 MED ORDER — IPRATROPIUM BROMIDE 0.06 % NA SOLN: 2.0000 | Freq: Four times a day (QID) | NASAL | 0 refills | Status: DC

## 2016-10-09 MED ORDER — BENZONATATE 100 MG PO CAPS: 100.0000 mg | ORAL_CAPSULE | Freq: Three times a day (TID) | ORAL | 1 refills | Status: DC | PRN

## 2016-10-09 NOTE — Patient Instructions (Addendum)
Thank you for coming to the clinic today.  1. It sounds like you have a Sinusitis (Bacterial Infection) - this most likely started as an Upper Respiratory Virus that has settled into an infection. Allergies can also cause this. - Start Augmentin 1 pill twice daily (breakfast and dinner, with food and plenty of water) for 10 days, complete entire course, do not stop early even if feeling better - Start Atrovent nasal spray decongestant 2 sprays in each nostril up to 4 times daily for 7 days - Start Tessalon Perls take 1 capsule up to 3 times a day as needed for cough  In the future, if feeling better but have some congestion / drainage - Start OTC Loratadine (Claritin) 10mg  daily and Flonase 2 sprays in each nostril daily for next 4-6 weeks, then you may stop and use seasonally or as needed - Recommend to keep using Nasal Saline spray multiple times a day to help flush out congestion and clear sinuses - Improve hydration by drinking plenty of clear fluids (water, gatorade) to reduce secretions and thin congestion - Congestion draining down throat can cause irritation. May try warm herbal tea with honey, cough drops  Keep using ibuprofen / motrin 600mg  every 6 hours or 3-4 times daily for short-term  - Can take Tylenol or Ibuprofen as needed for fevers  If you develop persistent fever >101F for at least 3 consecutive days, headaches with sinus pain or pressure or persistent earache, please schedule a follow-up evaluation within next few days to week.  ---- For future labs - check with your insurance company - on which [employer] they prefer - we use [facility] OR you can go to [facility] (outside facility)  - Lab tests that I would be ordering - Complete Metabolic Panel (CMET), CBC (Complete Blood Count), Lipid panel (Cholesterol), Hemoglobin A1c (Diabetes screening), routine screening for Hepatitis C and HIV (once in a lifetime)  Please schedule a Follow-up Appointment to: Return in  about 3 months (around 01/09/2017) for Annual Physical.  If you have any other questions or concerns, please feel free to call the clinic or send a message through [place]. You may also schedule an earlier appointment if necessary.  Additionally, you may be receiving a survey about your experience at our clinic within a few days to 1 week by e-mail or mail. We value your feedback.  Nobie Putnam, DO [place]

## 2016-10-09 NOTE — Progress Notes (Signed)
Subjective:    Patient ID: Alyssa Bradford, female    DOB: July 02, 1960, 56 y.o.   MRN: [id number]  Alyssa Bradford is a 56 y.o. female presenting on 10/09/2016 for Sore Throat (headache, coughing, sneezing, and runny nose )  Patient presents for a same day appointment.  HPI   SORE THROAT / SINUSITIS: - Reports symptoms started 6 days ago onset with "terrible" migraine headache, then afterwards developed sore throat with gradual worsening, including nasal and sinus drainage with sore throat, difficulty with eating and swallowing due to pain, does better swallowing liquids. Admits sick contact daughter with sinus infection same time period when she got sick. - Taking Motrin OTC 600mg  q 6 hours. Tried DayQuil/NyQuil, Alkaseltzer +Cold. - Admits frontal sinus headaches, but no further migraine - Admits some neck pain - Admits non productive coughing - Denies fevers/chills sweats, abdominal pain, nausea, vomiting, sputum production  PMH s/p tonsillectomy   Social History  Substance Use Topics  . Smoking status: Never Smoker  . Smokeless tobacco: Never Used  . Alcohol use No    Review of Systems Per HPI unless specifically indicated above     Objective:    BP 127/62 (BP Location: Right Arm, Patient Position: Sitting, Cuff Size: Normal)   Pulse 80   Temp 98.7 F (37.1 C) (Oral)   Ht 5\' 8"  (1.727 m)   Wt 180 lb (81.6 kg)   SpO2 99%   BMI 27.37 kg/m   Wt Readings from Last 3 Encounters:  10/09/16 180 lb (81.6 kg)  06/08/16 182 lb (82.6 kg)  07/21/15 172 lb (78 kg)    Physical Exam  Constitutional: She is oriented to person, place, and time. She appears well-developed and well-nourished. No distress.  Mildly ill-appearing, comfortable, cooperative  HENT:  Head: Normocephalic and atraumatic.  Frontal / maxillary sinuses tender bilateral. Nares with mild to moderate turbinate edema with congestion without purulence.   R TM mostly normal appearance, limited view with some soft cerumen  non impacting. Left TM with mild fullness and effusion without significant erythema.  Oropharynx with generalized erythema some cobblestoning without tonsils s/p tonsillectomy without exudates or asymmetry.  Eyes: Conjunctivae are normal. Right eye exhibits no discharge. Left eye exhibits no discharge.  Neck: Normal range of motion. Neck supple.  Mild tender anterior cervical bilateral, without significant LAD  Cardiovascular: Normal rate, regular rhythm, normal heart sounds and intact distal pulses.   No murmur heard. Pulmonary/Chest: Effort normal and breath sounds normal. No respiratory distress. She has no wheezes. She has no rales.  Good air movement. Speaks full sentences. Occasional coughing.  Musculoskeletal: She exhibits no edema.  Lymphadenopathy:    She has no cervical adenopathy.  Neurological: She is alert and oriented to person, place, and time.  Skin: Skin is warm and dry. No rash noted. She is not diaphoretic. No erythema.  Psychiatric: She has a normal mood and affect. Her behavior is normal.  Nursing note and vitals reviewed.  Results for orders placed or performed in visit on 10/09/16  POCT rapid strep A  Result Value Ref Range   Rapid Strep A Screen Negative Negative      Assessment & Plan:   Problem List Items Addressed This Visit    None    Visit Diagnoses    Acute non-recurrent frontal sinusitis    -  Primary  Consistent with acute frontal vs maxillary sinusitis, likely initially viral URI vs allergic rhinitis component with worsening concern for bacterial infection now >1 week  worse symptoms  Plan: 1. Since not clinically improved and concern given international air travel coming soon within 1 week - agree to Start Augmentin 875-125mg  PO BID x 10 days 2. Start Tessalon Perls take 1 capsule up to 3 times a day as needed for cough 3. Start Atrovent nasal spray decongestant 2 sprays in each nostril up to 4 times daily for 7 days 4. If improved but not  resolved with some posterior nasal drainage - can start OTC Loratadine (Claritin) 10mg  daily and Flonase 2 sprays in each nostril daily for next 4-6 weeks, then may stop and use seasonally or as needed 5. Supportive care with nasal saline OTC, hydration 6. Return criteria reviewed     Relevant Medications   ipratropium (ATROVENT) 0.06 % nasal spray   amoxicillin-clavulanate (AUGMENTIN) 875-125 MG tablet   benzonatate (TESSALON) 100 MG capsule   Sore throat      Rapid strep negative, low risk for strep. S/p tonsillectomy See above therapy for sinusitis as likely etiology of sore throat     Relevant Orders   POCT rapid strep A (Completed)      Meds ordered this encounter  Medications  . ipratropium (ATROVENT) 0.06 % nasal spray    Sig: Place 2 sprays into both nostrils 4 (four) times daily. For up to 5-7 days then stop.    Dispense:  15 mL    Refill:  0  . amoxicillin-clavulanate (AUGMENTIN) 875-125 MG tablet    Sig: Take 1 tablet by mouth 2 (two) times daily. For 10 days    Dispense:  20 tablet    Refill:  0  . benzonatate (TESSALON) 100 MG capsule    Sig: Take 1 capsule (100 mg total) by mouth 3 (three) times daily as needed for cough.    Dispense:  30 capsule    Refill:  1     Follow up plan: Return in about 3 months (around 01/09/2017) for Annual Physical.   She is to notify office once determine cost/coverage on labs, so that we can choose appropriate lab for patient to go to and will need to order labs.  Nobie Putnam, [facility] 10/10/2016, 12:01 AM

## 2016-11-04 ENCOUNTER — Other Ambulatory Visit: Payer: Self-pay | Admitting: Family Medicine

## 2016-11-04 DIAGNOSIS — M722 Plantar fascial fibromatosis: Secondary | ICD-10-CM

## 2017-02-12 ENCOUNTER — Other Ambulatory Visit: Payer: Self-pay | Admitting: Family Medicine

## 2017-02-12 DIAGNOSIS — M722 Plantar fascial fibromatosis: Secondary | ICD-10-CM

## 2017-02-14 ENCOUNTER — Other Ambulatory Visit: Payer: Self-pay | Admitting: Family Medicine

## 2017-02-14 DIAGNOSIS — M722 Plantar fascial fibromatosis: Secondary | ICD-10-CM

## 2017-04-05 ENCOUNTER — Other Ambulatory Visit: Payer: Self-pay

## 2017-04-05 DIAGNOSIS — Z1231 Encounter for screening mammogram for malignant neoplasm of breast: Secondary | ICD-10-CM

## 2017-06-04 ENCOUNTER — Other Ambulatory Visit: Payer: Self-pay | Admitting: General Surgery

## 2017-06-04 ENCOUNTER — Ambulatory Visit
Admission: RE | Admit: 2017-06-04 | Discharge: 2017-06-04 | Disposition: A | Discharge status: Home / Self Care | Payer: 59 | Source: Ambulatory Visit | Attending: General Surgery | Admitting: General Surgery

## 2017-06-04 DIAGNOSIS — Z1231 Encounter for screening mammogram for malignant neoplasm of breast: Secondary | ICD-10-CM | POA: Insufficient documentation

## 2017-06-14 ENCOUNTER — Ambulatory Visit: Payer: Managed Care, Other (non HMO) | Admitting: General Surgery

## 2017-06-26 ENCOUNTER — Ambulatory Visit: Payer: Managed Care, Other (non HMO) | Admitting: General Surgery

## 2017-07-17 ENCOUNTER — Ambulatory Visit (INDEPENDENT_AMBULATORY_CARE_PROVIDER_SITE_OTHER): Payer: 59 | Admitting: General Surgery

## 2017-07-17 ENCOUNTER — Encounter: Payer: Self-pay | Admitting: General Surgery

## 2017-07-17 VITALS — BP 126/78 | HR 84 | Resp 12 | Ht 68.0 in | Wt 172.0 lb

## 2017-07-17 DIAGNOSIS — Z86000 Personal history of in-situ neoplasm of breast: Secondary | ICD-10-CM

## 2017-07-17 NOTE — Patient Instructions (Addendum)
The patient is aware to call back for any questions or new concerns. Patient will be asked to return to the office in one year with a bilateral screening mammogram.  

## 2017-07-17 NOTE — Progress Notes (Addendum)
Patient ID: Alyssa Bradford, female   DOB: 05-30-1960, 57 y.o.   MRN: [id number]  Chief Complaint  Patient presents with  . Follow-up    HPI Alyssa Bradford is a 57 y.o. female.  who presents for her follow up right breast cancer and a breast evaluation. The most recent mammogram was done on 06-04-17.  Patient does perform regular self breast checks and gets regular mammograms done.   No new breast issues. She states the right breast is still a little tender to touch. 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  HPI  Past Medical History:  Diagnosis Date  . Breast cancer [facility]) 2011   right breast with lumpectomy and rad tx in [place]  . 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 Tamoxifen x 3 years, then exemestane. [facility], [place], [place].   . Melanoma [facility])     Past Surgical History:  Procedure Laterality Date  . ABDOMINAL HYSTERECTOMY    . BREAST BIOPSY Left 2001   benign  . BREAST EXCISIONAL BIOPSY Right 2011   + with rad tx   . BREAST LUMPECTOMY  2011  . BREAST SURGERY  2001   BIOPSY  . COLONOSCOPY WITH PROPOFOL N/A 07/21/2015   Procedure: COLONOSCOPY WITH PROPOFOL;  Surgeon: Christene Lye, MD;  Location: [facility] ENDOSCOPY;  Service: Endoscopy;  Laterality: N/A;  . NM PET DX MELANOMA    . TONSILLECTOMY      Family History  Problem Relation Age of Onset  . Cancer Father        BLADDER /SKIN   . Heart disease Father        Stent placement & CABG x 3  . Heart attack Father 54  . Breast cancer Maternal Grandmother        70's  . Breast cancer Paternal Grandmother        57's    Social History Social History   Tobacco Use  . Smoking status: Never Smoker  . Smokeless tobacco: Never Used  Substance Use Topics  . Alcohol use: No  . Drug use: No    Allergies  Allergen Reactions  . Latex     Current Outpatient Medications  Medication Sig Dispense Refill  .  meloxicam (MOBIC) 15 MG tablet TAKE 1 TABLET BY MOUTH DAILY 30 tablet 2   No current facility-administered medications for this visit.     Review of Systems Review of Systems  Constitutional: Negative.   Respiratory: Negative.   Cardiovascular: Negative.     Blood pressure 126/78, pulse 84, resp. rate 12, height 5\' 8"  (1.727 m), weight 172 lb (78 kg), SpO2 95 %.  Physical Exam Physical Exam  Constitutional: She is oriented to person, place, and time. She appears well-developed and well-nourished.  HENT:  Mouth/Throat: Oropharynx is clear and moist.  Eyes: Conjunctivae are normal. No scleral icterus.  Neck: Neck supple.  Cardiovascular: Normal rate, regular rhythm and normal heart sounds.  Pulmonary/Chest: Effort normal and breath sounds normal. Right breast exhibits no inverted nipple, no mass, no nipple discharge, no skin change and no tenderness. Left breast exhibits no inverted nipple, no mass, no nipple discharge, no skin change and no tenderness.  Well healed right lumpectomy site.   Lymphadenopathy:    She has no cervical adenopathy.    She has no axillary adenopathy.  Neurological: She is alert and oriented to person, place, and time.  Skin: Skin  is warm and dry.  Psychiatric: Her behavior is normal.    Data Reviewed June 04, 2017 screening mammograms reviewed.  BI-RADS-2.  Assessment    Benign breast exam.    Plan    Patient will be asked to return to the office in one year with a bilateral screening mammogram. The patient is aware to call back for any questions or new concerns.      HPI, Physical Exam, Assessment and Plan have been scribed under the direction and in the presence of Robert Bellow, MD. Karie Fetch, RN  I have completed the exam and reviewed the above documentation for accuracy and completeness.  I agree with the above.  Haematologist has been used and any errors in dictation or transcription are unintentional.  Hervey Ard,  M.D., F.A.C.S.  Forest Gleason Drayven Marchena 07/19/2017, 10:01 AM

## 2017-07-18 DIAGNOSIS — Z86000 Personal history of in-situ neoplasm of breast: Secondary | ICD-10-CM | POA: Insufficient documentation

## 2017-07-19 ENCOUNTER — Encounter: Payer: Self-pay | Admitting: General Surgery

## 2017-11-06 ENCOUNTER — Telehealth: Payer: Self-pay | Admitting: Family Medicine

## 2017-11-06 NOTE — Telephone Encounter (Signed)
Patient appointment scheduled for physical and referral.

## 2017-11-06 NOTE — Telephone Encounter (Signed)
Pt had a bladder suspension years ago and asked to have a referral to urology.  Please call [phone]

## 2017-11-08 ENCOUNTER — Ambulatory Visit: Payer: 59 | Admitting: Family Medicine

## 2017-11-09 ENCOUNTER — Ambulatory Visit (INDEPENDENT_AMBULATORY_CARE_PROVIDER_SITE_OTHER): Payer: 59 | Admitting: Family Medicine

## 2017-11-09 ENCOUNTER — Encounter: Payer: Self-pay | Admitting: Family Medicine

## 2017-11-09 ENCOUNTER — Ambulatory Visit
Admission: RE | Admit: 2017-11-09 | Discharge: 2017-11-09 | Disposition: A | Discharge status: Home / Self Care | Payer: 59 | Source: Ambulatory Visit | Attending: Family Medicine | Admitting: Family Medicine

## 2017-11-09 ENCOUNTER — Other Ambulatory Visit: Payer: Self-pay

## 2017-11-09 VITALS — BP 106/59 | HR 82 | Temp 99.1°F | Ht 68.0 in | Wt 160.0 lb

## 2017-11-09 DIAGNOSIS — M150 Primary generalized (osteo)arthritis: Secondary | ICD-10-CM | POA: Insufficient documentation

## 2017-11-09 DIAGNOSIS — M159 Polyosteoarthritis, unspecified: Secondary | ICD-10-CM

## 2017-11-09 DIAGNOSIS — N3941 Urge incontinence: Secondary | ICD-10-CM

## 2017-11-09 DIAGNOSIS — G8929 Other chronic pain: Secondary | ICD-10-CM

## 2017-11-09 DIAGNOSIS — M25552 Pain in left hip: Secondary | ICD-10-CM

## 2017-11-09 DIAGNOSIS — N8110 Cystocele, unspecified: Secondary | ICD-10-CM

## 2017-11-09 MED ORDER — MELOXICAM 15 MG PO TABS: 15.0000 mg | ORAL_TABLET | Freq: Every day | ORAL | 2 refills | Status: DC | PRN

## 2017-11-09 NOTE — Assessment & Plan Note (Signed)
Gradual worsening chronic problem, s/p surgery >25 years ago Now given worsening urgency and pressure symptoms, she requests to return to Urologist locally - Will provide them with out of state urology Dr Benjaman Kindler info and they can request her Urology records - Referral placed to Dr Matilde Sprang preferred locally in [place] she is willing to go to [place] if need

## 2017-11-09 NOTE — Patient Instructions (Addendum)
Thank you for coming to the office today.  Call insurance find out cost and coverage of the following - let them know it is for "Annual Physical" or "Wellness Exam"  - Complete Metabolic Panel (CMET) - Complete Blood Count (CBC) - Fasting Lipid Panel (cholesterol) - Hemoglobin A1c (diabetes screen) - Hepatitis C Screening  Labs will be drawn at our office - company is [facility]  Dr. Carney Living, M.D. Urologist in [place], [place] Address: [address] [place] [address] Phone: [phone]  I can refill the meloxicam for your hip and knee - take it as needed as we have discussed, take with food - try not to use every day  Recommend to start taking Tylenol Extra Strength 500mg  tabs - take 1 to 2 tabs per dose (max 1000mg ) every 6-8 hours for pain (take regularly, don't skip a dose for next 7 days), max 24 hour daily dose is 6 tablets or 3000mg . In the future you can repeat the same everyday Tylenol course for 1-2 weeks at a time.   X-ray for L hip  Referral to Physical Therapy  DUE for FASTING BLOOD WORK (no food or drink after midnight before the lab appointment, only water or coffee without cream/sugar on the morning of)  ON SAME DAY OF APPOINTMENT  - Make sure Lab Only appointment is at about 1 week before your next appointment, so that results will be available  For Lab Results, once available within 2-3 days of blood draw, you can can log in to MyChart online to view your results and a brief explanation. Also, we can discuss results at next follow-up visit.   Please schedule a Follow-up Appointment to: Return in about 4 weeks (around 12/05/2017) for Annual Physical - keep apt.  If you have any other questions or concerns, please feel free to call the office or send a message through [facility]. You may also schedule an earlier appointment if necessary.  Additionally, you may be receiving a survey about your experience at our office within a few  days to 1 week by e-mail or mail. We value your feedback.  Nobie Putnam, DO [place]

## 2017-11-09 NOTE — Progress Notes (Signed)
Subjective:    Patient ID: Alyssa Bradford, female    DOB: Mar 06, 1961, 57 y.o.   MRN: [id number]  Alyssa Bradford is a 57 y.o. female presenting on 11/09/2017 for Bladder Prolapse (referral to Urology, pt have a history of bladder suspension and hysterectomy.) and Hip Pain (intermittent left hip and knee painx 6 mths. Pain worsen w/ sitting & bedtime)   HPI   Bladder Prolpase / History of prior surgery w/ Bladder Suspension, Hysterectomy and reconstruction. Reports she has prior history of multiple surgeries approx age 75, >25 years ago as reported. She had these procedures done at the time she was living out of state in [place], performed by Urologist Dr Benjaman Kindler. She was told may last 10 years approximately, however she did very well with these and now is only starting to have problems - She admits new problem gradual worse over past 6 months, experiences pressure with prolapse, episodic, seems more constant irritation  with some occasional incontinence urge related, and some urgency - She called [facility] and was told she needed a referral, she would like to see Dr Matilde Sprang  Chronic Pain Left Hip / Knee / Neck / Multiple joint arthralgias Reports no confirmed diagnosis of osteoarthritis in the past, she has not had significant x-rays done of her joints in question. This is a longer term issue that has not bothered her as much before, but seems to be gradually worsening. She describes no prior acute injury or trauma to her hip or knee in the past. 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 - Reports gradual worsening aching pain mild to moderate in Left hip, worse by end of long day, will get stiff and sore if prolonged seated >30 min, will occasionally wake her up at night if on hip - Also similar symptoms with her Left knee but not as severe as the hip -  Additionally has neck pain worse at top of neck radiating to front with some tension headaches at times - Tried extended relief Tylenol with limited relief. Taking some tylenol PM - In past she was given meloxicam rx for her plantar fasciitis, this helped her knee and hip she noticed years ago. Now she needs new refill on this. No labs recently last done 2016 She is interested in physical therapy and resuming treatment for her joints today Denies any fall injury, swelling, redness, bruising, numbness tingling weakness   Past Surgical History:  Procedure Laterality Date  . ABDOMINAL HYSTERECTOMY    . BLADDER SUSPENSION     approximately 25 yrs ago  . BREAST BIOPSY Left 2001   benign  . BREAST EXCISIONAL BIOPSY Right 2011   + with rad tx   . BREAST LUMPECTOMY  2011  . BREAST SURGERY  2001   BIOPSY  . COLONOSCOPY WITH PROPOFOL N/A 07/21/2015   Procedure: COLONOSCOPY WITH PROPOFOL;  Surgeon: Christene Lye, MD;  Location: [facility] ENDOSCOPY;  Service: Endoscopy;  Laterality: N/A;  . NM PET DX MELANOMA    . TONSILLECTOMY      Depression screen [facility] 2/9 11/09/2017 02/22/2015  Decreased Interest 0 0  Down, Depressed, Hopeless 0 0  PHQ - 2 Score 0 0    Social History   Tobacco Use  . Smoking status: Never Smoker  . Smokeless tobacco: Never Used  Substance Use Topics  .  Alcohol use: No  . Drug use: No    Review of Systems Per HPI unless specifically indicated above     Objective:    BP (!) 106/59 (BP Location: Left Arm, Patient Position: Sitting, Cuff Size: Normal)   Pulse 82   Temp 99.1 F (37.3 C) (Oral)   Ht 5\' 8"  (1.727 m)   Wt 160 lb (72.6 kg)   LMP  (Exact Date)   BMI 24.33 kg/m   Wt Readings from Last 3 Encounters:  11/09/17 160 lb (72.6 kg)  07/17/17 172 lb (78 kg)  10/09/16 180 lb (81.6 kg)    Physical Exam  Constitutional: She is oriented to person, place, and time. She appears well-developed and well-nourished. No distress.  Well-appearing,  comfortable, cooperative  HENT:  Head: Normocephalic and atraumatic.  Mouth/Throat: Oropharynx is clear and moist.  Eyes: Conjunctivae are normal. Right eye exhibits no discharge. Left eye exhibits no discharge.  Neck:  Neck Inspection: No deformity or abnormality Palpation: Non tender. Mild upper cervical paraspinal muscle hypertonicity bilateral ROM: full active ROM flex/ext/rotation Strength: distal intact Neurovascular: distal intact   Cardiovascular: Normal rate and intact distal pulses.  Pulmonary/Chest: Effort normal.  Abdominal: Soft. Bowel sounds are normal. She exhibits no distension and no mass. There is no tenderness. There is no guarding.  Musculoskeletal: She exhibits no edema.  Low Back / L hip and Greater Trochanter Inspection: BACK - Normal appearance, no spinal deformity, symmetrical. HIP - Normal appearance, symmetrical, no obvious leg length or pelvis deformity  Palpation: BACK - No tenderness over spinous processes. Bilateral lumbar paraspinal muscles non-tender and without hypertonicity/spasm. HIP - Mild tender to palpation deeper L greater trochanter region of lateral upper thigh. Lower extremity thigh calf soft non tender no spasm.  ROM: BACK - Full active ROM forward flex / back extension, rotation L/R without discomfort HIP - Bilateral hip flex/ext supine normal, internal and external rotation normal without problem or limitation.  Special Testing: BACK - Seated SLR negative for radicular pain bilaterally HIP - FABER FADIR normal and non tender no limited movement. Acetabular compression of hip L is non tender.  Strength: Bilateral hip flex/ext 5/5, knee flex/ext 5/5, ankle dorsiflex/plantarflex 5/5 Neurovascular: intact distal sensation to light touch  Knee - Left Normal appearance. Good ROM without obvious crepitus. No joint deformity or effusion. Non tender joint line.  Neurological: She is alert and oriented to person, place, and time.  Skin: Skin  is warm and dry. No rash noted. She is not diaphoretic. No erythema.  Psychiatric: She has a normal mood and affect. Her behavior is normal.  Well groomed, good eye contact, normal speech and thoughts  Nursing note and vitals reviewed.  Results for orders placed or performed in visit on 10/09/16  POCT rapid strep A  Result Value Ref Range   Rapid Strep A Screen Negative Negative      Assessment & Plan:   Problem List Items Addressed This Visit    Bladder prolapse, female, acquired - Primary    Gradual worsening chronic problem, s/p surgery >25 years ago Now given worsening urgency and pressure symptoms, she requests to return to Urologist locally - Will provide them with out of state urology Dr Benjaman Kindler info and they can request her Urology records - Referral placed to Dr Matilde Sprang preferred locally in [place] she is willing to go to [place] if need      Relevant Orders   Ambulatory referral to Urology   Chronic left hip pain  Subacute on chronic gradual worsening problem with L Hip pain, likely component of OA/DJD with general stiff and ache also seems to have trochanteric bursitis given history and exam with point tenderness.  No known imaging or confirmed diagnosis - No radiation of pain or radicular symptoms - Improved on NSAID, limited on tylenol  Plan: 1. Check L Hip X-ray today for baseline 2. Refill anti-inflammatory trial with rx Meloxicam 15mg  wc daily PRN - review needs to take only intermittent as discussed - overdue for labs, will check chemistry next month with annual phys and will follow-up on proper NSAID use 3. Take tylenol PRN breakthrough, future may consider muscle relaxant if need 4. Encouraged use of heating pad 1-2x daily for now then PRN. 5. Try to stay active, work on stretching and exercises - Referral to [facility] handwritten given to patient 6. Follow-up 4-6 weeks if not improving - will be at physical soon and then in future if  need we can reconsider orthopedics vs injection for troch bursa      Relevant Medications   meloxicam (MOBIC) 15 MG tablet   Other Relevant Orders   DG HIP UNILAT WITH PELVIS 2-3 VIEWS LEFT   Primary osteoarthritis involving multiple joints    Likely etiology underlying for several of her issues today with L hip pain, knee pain and neck pain also with joint stiffness. - See A&P for hip  Also for her neck pain/stiffness, without injury - will request Nicole Kindred PT - will already been on NSAID Same for knee - in future we can consider L Knee X-ray      Relevant Medications   meloxicam (MOBIC) 15 MG tablet   Other Relevant Orders   DG HIP UNILAT WITH PELVIS 2-3 VIEWS LEFT    Other Visit Diagnoses    Urge incontinence       Relevant Orders   Ambulatory referral to Urology      Meds ordered this encounter  Medications  . meloxicam (MOBIC) 15 MG tablet    Sig: Take 1 tablet (15 mg total) by mouth daily as needed for pain.    Dispense:  30 tablet    Refill:  2    Orders Placed This Encounter  Procedures  . DG HIP UNILAT WITH PELVIS 2-3 VIEWS LEFT    Standing Status:   Future    Number of Occurrences:   1    Standing Expiration Date:   01/11/2019    Order Specific Question:   Reason for Exam (SYMPTOM  OR DIAGNOSIS REQUIRED)    Answer:   chronic hip pain and stiffness, suspected arthritis, no injury or trauma    Order Specific Question:   Is patient pregnant?    Answer:   No    Order Specific Question:   Preferred imaging location?    Answer:   [facility]    Order Specific Question:   Radiology Contrast Protocol - do NOT remove file path    Answer:   [email or web address]  . Ambulatory referral to Urology    Referral Priority:   Routine    Referral Type:   Consultation    Referral Reason:   Specialty Services Required    Referred to Provider:   Bjorn Loser, MD    Requested Specialty:   Urology    Number of Visits Requested:   1      Follow up plan: Return in about 4 weeks (around 12/05/2017) for Annual Physical - keep apt.  No future labs  placed today - she will be fasting at her upcoming physical apt and will place orders then if she advises which labs she would like - she is checking cost/coverage from insurance first.  Nobie Putnam, [facility] 11/09/2017, 1:27 PM

## 2017-11-09 NOTE — Assessment & Plan Note (Signed)
Subacute on chronic gradual worsening problem with L Hip pain, likely component of OA/DJD with general stiff and ache also seems to have trochanteric bursitis given history and exam with point tenderness.  No known imaging or confirmed diagnosis - No radiation of pain or radicular symptoms - Improved on NSAID, limited on tylenol  Plan: 1. Check L Hip X-ray today for baseline 2. Refill anti-inflammatory trial with rx Meloxicam 15mg  wc daily PRN - review needs to take only intermittent as discussed - overdue for labs, will check chemistry next month with annual phys and will follow-up on proper NSAID use 3. Take tylenol PRN breakthrough, future may consider muscle relaxant if need 4. Encouraged use of heating pad 1-2x daily for now then PRN. 5. Try to stay active, work on stretching and exercises - Referral to [facility] handwritten given to patient 6. Follow-up 4-6 weeks if not improving - will be at physical soon and then in future if need we can reconsider orthopedics vs injection for troch bursa

## 2017-11-09 NOTE — Assessment & Plan Note (Addendum)
Likely etiology underlying for several of her issues today with L hip pain, knee pain and neck pain also with joint stiffness. - See A&P for hip  Also for her neck pain/stiffness, without injury - will request Nicole Kindred PT - will already been on NSAID Same for knee - in future we can consider L Knee X-ray

## 2017-12-05 ENCOUNTER — Encounter: Payer: 59 | Admitting: Family Medicine

## 2017-12-05 ENCOUNTER — Encounter: Payer: Self-pay | Admitting: Family Medicine

## 2017-12-05 ENCOUNTER — Ambulatory Visit (INDEPENDENT_AMBULATORY_CARE_PROVIDER_SITE_OTHER): Payer: 59 | Admitting: Family Medicine

## 2017-12-05 VITALS — BP 134/68 | HR 75 | Temp 98.5°F | Resp 16 | Ht 68.0 in | Wt 160.0 lb

## 2017-12-05 DIAGNOSIS — Z9889 Other specified postprocedural states: Secondary | ICD-10-CM

## 2017-12-05 DIAGNOSIS — Z1159 Encounter for screening for other viral diseases: Secondary | ICD-10-CM

## 2017-12-05 DIAGNOSIS — M150 Primary generalized (osteo)arthritis: Secondary | ICD-10-CM | POA: Diagnosis not present

## 2017-12-05 DIAGNOSIS — M25552 Pain in left hip: Secondary | ICD-10-CM

## 2017-12-05 DIAGNOSIS — G8929 Other chronic pain: Secondary | ICD-10-CM

## 2017-12-05 DIAGNOSIS — Z124 Encounter for screening for malignant neoplasm of cervix: Secondary | ICD-10-CM

## 2017-12-05 DIAGNOSIS — Z0000 Encounter for general adult medical examination without abnormal findings: Secondary | ICD-10-CM | POA: Diagnosis not present

## 2017-12-05 DIAGNOSIS — Z86000 Personal history of in-situ neoplasm of breast: Secondary | ICD-10-CM

## 2017-12-05 DIAGNOSIS — Z853 Personal history of malignant neoplasm of breast: Secondary | ICD-10-CM

## 2017-12-05 DIAGNOSIS — Z85820 Personal history of malignant melanoma of skin: Secondary | ICD-10-CM

## 2017-12-05 DIAGNOSIS — M159 Polyosteoarthritis, unspecified: Secondary | ICD-10-CM

## 2017-12-05 NOTE — Assessment & Plan Note (Signed)
Likely etiology underlying for chronic L hip and lower extremity pain - See A&P for hip - Improved on NSAID intermittent meloxicam, did not proceed with Nicole Kindred PT yet - has order still

## 2017-12-05 NOTE — Assessment & Plan Note (Signed)
Significant improvement on intermittent meloxicam NSAID Last imaging X-ray 10/2017, negative for OA/DJD - No radiation of pain or radicular symptoms  Plan: 1. Continue intermittent Meloxicam 15mg  q 3-4 days PRN - caution too frequent use 2. Use tylenol breakthrough 3. Continue conservative care 4. May proceed with [facility] when ready - has order, may defer or decline this if chooses Follow-up in future if need we can reconsider orthopedics vs injection for troch bursa

## 2017-12-05 NOTE — Assessment & Plan Note (Addendum)
History of breast cancer DCIS s/p tamoxifen, radiation

## 2017-12-05 NOTE — Patient Instructions (Addendum)
Thank you for coming to the office today.  Glad to hear hip pain is improved. Continue Meloxicam as you are. Notify us if any concerns or questions and return sooner if needed.  Pap smear today - stay tuned for results. If double negative test good for 3-5 years.  Mammograms yearly - continue in February 2020  Due for Flu shot - send [place] a copy of the record.  Colonoscopy 2027  DUE for FASTING BLOOD WORK (no food or drink after midnight before the lab appointment, only water or coffee without cream/sugar on the morning of)  SCHEDULE "Lab Only" visit in the morning at the clinic for lab draw in 1 YEAR  - Make sure Lab Only appointment is at about 1 week before your next appointment, so that results will be available  For Lab Results, once available within 2-3 days of blood draw, you can can log in to MyChart online to view your results and a brief explanation. Also, we can discuss results at next follow-up visit.   Please schedule a Follow-up Appointment to: Return in about 1 year (around 12/06/2018) for Annual Physical.  If you have any other questions or concerns, please feel free to call the office or send a message through [facility]. You may also schedule an earlier appointment if necessary.  Additionally, you may be receiving a survey about your experience at our office within a few days to 1 week by e-mail or mail. We value your feedback.  Nobie Putnam, DO [place]

## 2017-12-05 NOTE — Assessment & Plan Note (Signed)
Ordered routine surveillance mammogram - next due 11/2017 History of breast cancer DCIS s/p tamoxifen, radiation

## 2017-12-05 NOTE — Assessment & Plan Note (Signed)
Stable without known recurrence L lower leg Return to her existing out of state, [place] [facility] for surveillance yearly

## 2017-12-05 NOTE — Progress Notes (Signed)
Subjective:    Patient ID: Alyssa Bradford, female    DOB: 19-Jun-1960, 57 y.o.   MRN: [id number]  Alyssa Bradford is a 57 y.o. female presenting on 12/05/2017 for Annual Exam   HPI  Here for Annual Physical and Due for fasting lab draw today.  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 Without regular exercise routine.  Follow-up Left Hip Pain OA/DJD - Since last visit 10/2017, she was restarted on Meloxicam 15mg , and now only taking one tablet every 3-4 days if pain but otherwise doing very well on intermittently. Not needing Tylenol as breakthrough. She was also given referral handwritten to [facility] and has not started this yet and wants to wait since significant improvement on NSAID.  History of Melanoma, s/p excision History of Melanoma excision in 2007 from Left lower leg, by Dermatology in [place]. She continues with them for yearly follow-up. No new concerns.  Follow-up Bladder Prolpase / History of prior surgery w/ Bladder Suspension, Hysterectomy and reconstruction. - Scheduled to see [facility] in 1 week to discuss options for this  Health Maintenance:  Declines TDap today.  Due for Flu Shot, will get [facility] and provide document  Cervical Cancer Screening: Due today. Last pap smear approx 5 years ago 2014, negative by report. She has not had abnormal pap. She still has cervix and ovaries, after subtotal abdominal hysterectomy about 25-26 years ago.  Breast CA Screening: UTD on mammogram. Last mammogram result negative bi-rads 2 (06/04/17) done at [facility]. Prior abnormal results with dx Breast Cancer in 2010 s/p tamoxifen and radiation - and also years ago had R breast benign abnormality excision biopsy, no recent abnormals in past several years.  Known family history of breast cancer both grandmothers in family  with breast cancer age 24-70s. Currently asymptomatic.  Colon CA Screening: Last Colonoscopy 07/21/15 (done by Dr [facility]), results with no polyps or abnormality, good for 10 years, 2027. Currently asymptomatic. No known family history of colon CA. UTD currently.   Depression screen [facility] 2/9 12/05/2017 11/09/2017 02/22/2015  Decreased Interest 0 0 0  Down, Depressed, Hopeless 0 0 0  PHQ - 2 Score 0 0 0    Past Medical History:  Diagnosis Date  . Breast cancer [facility]) 2011   right breast with lumpectomy and rad tx in [place]  . Breast cancer, stage 0, right 02/22/2009   DCIS, 1 cm, ER/PR positive, inferior margin 1-84mm; whole breast radiation. Tamoxifen x 3 years, then exemestane. [facility], [place], [place].   . Melanoma [facility])    Past Surgical History:  Procedure Laterality Date  . ABDOMINAL HYSTERECTOMY  1994   partial - still has cervix, ovaries  . BLADDER SUSPENSION     approximately 25 yrs ago  . BREAST BIOPSY Left 2001   benign  . BREAST EXCISIONAL BIOPSY Right 2011   + with rad tx   . BREAST LUMPECTOMY  2011  . BREAST SURGERY  2001   BIOPSY  . COLONOSCOPY WITH PROPOFOL N/A 07/21/2015   Procedure: COLONOSCOPY WITH PROPOFOL;  Surgeon: Christene Lye, MD;  Location: [facility] ENDOSCOPY;  Service: Endoscopy;  Laterality: N/A;  . NM PET DX MELANOMA    . TONSILLECTOMY     Social History   Socioeconomic History  . Marital status: Married    Spouse name: Not  on file  . Number of children: Not on file  . Years of education: Not on file  . Highest education level: Not on file  Occupational History  . Not on file  Social Needs  . Financial resource strain: Not on file  . Food insecurity:    Worry: Not on file    Inability: Not on file  . Transportation needs:    Medical: Not on file    Non-medical: Not on file  Tobacco Use  . Smoking status: Never Smoker  . Smokeless tobacco: Never Used  Substance and Sexual Activity  . Alcohol use: Yes      Comment: occasional  . Drug use: No  . Sexual activity: Not on file  Lifestyle  . Physical activity:    Days per week: Not on file    Minutes per session: Not on file  . Stress: Not on file  Relationships  . Social connections:    Talks on phone: Not on file    Gets together: Not on file    Attends religious service: Not on file    Active member of club or organization: Not on file    Attends meetings of clubs or organizations: Not on file    Relationship status: Not on file  . Intimate partner violence:    Fear of current or ex partner: Not on file    Emotionally abused: Not on file    Physically abused: Not on file    Forced sexual activity: Not on file  Other Topics Concern  . Not on file  Social History Narrative  . Not on file   Family History  Problem Relation Age of Onset  . Cancer Father        BLADDER /SKIN   . Heart disease Father        Stent placement & CABG x 3  . Heart attack Father 79  . Breast cancer Maternal Grandmother        70's  . Breast cancer Paternal Grandmother        53's   Current Outpatient Medications on File Prior to Visit  Medication Sig  . meloxicam (MOBIC) 15 MG tablet Take 1 tablet (15 mg total) by mouth daily as needed for pain.   No current facility-administered medications on file prior to visit.     Review of Systems  Constitutional: Negative for activity change, appetite change, chills, diaphoresis, fatigue and fever.  HENT: Negative for congestion and hearing loss.   Eyes: Negative for visual disturbance.  Respiratory: Negative for apnea, cough, choking, chest tightness, shortness of breath and wheezing.   Cardiovascular: Negative for chest pain, palpitations and leg swelling.  Gastrointestinal: Negative for abdominal pain, anal bleeding, blood in stool, constipation, diarrhea, nausea and vomiting.  Endocrine: Negative for cold intolerance.  Genitourinary: Positive for difficulty urinating (due to bladder prolapse) and  urgency. Negative for dysuria, frequency and hematuria.  Musculoskeletal: Positive for arthralgias (L hip - improved on NSAID). Negative for back pain and neck pain.  Skin: Negative for rash.  Allergic/Immunologic: Negative for environmental allergies.  Neurological: Negative for dizziness, weakness, light-headedness, numbness and headaches.  Hematological: Negative for adenopathy.  Psychiatric/Behavioral: Negative for behavioral problems, dysphoric mood and sleep disturbance. The patient is not nervous/anxious.    Per HPI unless specifically indicated above      Objective:    BP 134/68   Pulse 75   Temp 98.5 F (36.9 C) (Oral)   Resp 16   Ht 5'  8" (1.727 m)   Wt 160 lb (72.6 kg)   BMI 24.33 kg/m   Wt Readings from Last 3 Encounters:  12/05/17 160 lb (72.6 kg)  11/09/17 160 lb (72.6 kg)  07/17/17 172 lb (78 kg)    Physical Exam  Constitutional: She is oriented to person, place, and time. She appears well-developed and well-nourished. No distress.  Well-appearing, comfortable, cooperative  HENT:  Head: Normocephalic and atraumatic.  Mouth/Throat: Oropharynx is clear and moist.  Frontal / maxillary sinuses non-tender. Nares patent without purulence or edema. Bilateral TMs clear without erythema, effusion or bulging. Oropharynx clear without erythema, exudates, edema or asymmetry.  Eyes: Pupils are equal, round, and reactive to light. Conjunctivae and EOM are normal. Right eye exhibits no discharge. Left eye exhibits no discharge.  Neck: Normal range of motion. Neck supple. No thyromegaly present.  Cardiovascular: Normal rate, regular rhythm, normal heart sounds and intact distal pulses.  No murmur heard. Pulmonary/Chest: Effort normal and breath sounds normal. No respiratory distress. She has no wheezes. She has no rales.  Abdominal: Soft. Bowel sounds are normal. She exhibits no distension and no mass. There is no tenderness.  Genitourinary:  Genitourinary Comments: Normal  external female genitalia. Vaginal canal without lesions. Abnormal anatomy with bladder prolapse making speculum exam technically difficult to obtain view of cervix. Do not appreciate cervix today, by request stopped pelvic exam and did not attempt pap specimen today. No bleeding or discharge.  Pelvic Exam chaperoned by Frederich Cha, CMA  Musculoskeletal: Normal range of motion. She exhibits no edema or tenderness.  Upper / Lower Extremities: - Normal muscle tone, strength bilateral upper extremities 5/5, lower extremities 5/5  Lymphadenopathy:    She has no cervical adenopathy.  Neurological: She is alert and oriented to person, place, and time.  Distal sensation intact to light touch all extremities  Skin: Skin is warm and dry. No rash noted. She is not diaphoretic. No erythema.  Psychiatric: She has a normal mood and affect. Her behavior is normal.  Well groomed, good eye contact, normal speech and thoughts  Nursing note and vitals reviewed.      Assessment & Plan:   Problem List Items Addressed This Visit    Chronic left hip pain    Significant improvement on intermittent meloxicam NSAID Last imaging X-ray 10/2017, negative for OA/DJD - No radiation of pain or radicular symptoms  Plan: 1. Continue intermittent Meloxicam 15mg  q 3-4 days PRN - caution too frequent use 2. Use tylenol breakthrough 3. Continue conservative care 4. May proceed with [facility] PT when ready - has order, may defer or decline this if chooses Follow-up in future if need we can reconsider orthopedics vs injection for troch bursa      History of ductal carcinoma in situ (DCIS) of breast    History of breast cancer DCIS s/p tamoxifen, radiation      History of melanoma excision    Stable without known recurrence L lower leg Return to her existing out of state, [place] [facility] for surveillance yearly      Primary osteoarthritis involving multiple joints    Likely etiology underlying for chronic L hip  and lower extremity pain - See A&P for hip - Improved on NSAID intermittent meloxicam, did not proceed with [facility] yet - has order still       Other Visit Diagnoses    Annual physical exam    -  Primary  Updated Health Maintenance information - Unable to obtain pap specimen -  see below Orders placed today for routine labs - including Hep C screen Encouraged improvement to lifestyle with diet and exercise    Relevant Orders   Hemoglobin A1c   CBC with Differential/Platelet   COMPLETE METABOLIC PANEL WITH GFR   Lipid panel   Need for hepatitis C screening test       Relevant Orders   Hepatitis C antibody   History of breast cancer       Screening for cervical cancer     - Unable to obtain pap specimen defer for now, until after bladder prolapse treated or can refer to GYN most likely at that point      No orders of the defined types were placed in this encounter.   Follow up plan: Return in about 1 year (around 12/06/2018) for Annual Physical.  Future labs to be ordered for 12/04/18 after review of current results.  Nobie Putnam, [facility] 12/05/2017, 10:38 AM

## 2017-12-06 LAB — COMPLETE METABOLIC PANEL WITH GFR
AG Ratio: 1.9 (calc) (ref 1.0–2.5)
ALKALINE PHOSPHATASE (APISO): 52 U/L (ref 33–130)
ALT: 7 U/L (ref 6–29)
AST: 20 U/L (ref 10–35)
Albumin: 4.6 g/dL (ref 3.6–5.1)
BILIRUBIN TOTAL: 0.5 mg/dL (ref 0.2–1.2)
BUN: 17 mg/dL (ref 7–25)
CHLORIDE: 105 mmol/L (ref 98–110)
CO2: 29 mmol/L (ref 20–32)
Calcium: 9.2 mg/dL (ref 8.6–10.4)
Creat: 0.73 mg/dL (ref 0.50–1.05)
GFR, Est African American: 105 mL/min/{1.73_m2} (ref >=60)
GFR, Est Non African American: 91 mL/min/{1.73_m2} (ref >=60)
GLUCOSE: 88 mg/dL (ref 65–99)
Globulin: 2.4 g/dL (calc) (ref 1.9–3.7)
Potassium: 4.2 mmol/L (ref 3.5–5.3)
Sodium: 142 mmol/L (ref 135–146)
Total Protein: 7 g/dL (ref 6.1–8.1)

## 2017-12-06 LAB — CBC WITH DIFFERENTIAL/PLATELET
Basophils Absolute: 70 cells/uL (ref 0–200)
Basophils Relative: 1.7 %
Eosinophils Absolute: 98 cells/uL (ref 15–500)
Eosinophils Relative: 2.4 %
HCT: 36.4 % (ref 35.0–45.0)
Hemoglobin: 11.9 g/dL (ref 11.7–15.5)
Lymphs Abs: 1156 cells/uL (ref 850–3900)
MCH: 28.1 pg (ref 27.0–33.0)
MCHC: 32.7 g/dL (ref 32.0–36.0)
MCV: 85.8 fL (ref 80.0–100.0)
MONOS PCT: 8.5 %
MPV: 10.9 fL (ref 7.5–12.5)
NEUTROS ABS: 2427 {cells}/uL (ref 1500–7800)
Neutrophils Relative %: 59.2 %
PLATELETS: 233 10*3/uL (ref 140–400)
RBC: 4.24 10*6/uL (ref 3.80–5.10)
RDW: 14.5 % (ref 11.0–15.0)
TOTAL LYMPHOCYTE: 28.2 %
WBC: 4.1 10*3/uL (ref 3.8–10.8)
WBCMIX: 349 {cells}/uL (ref 200–950)

## 2017-12-06 LAB — LIPID PANEL
CHOLESTEROL: 211 mg/dL — AB (ref <200)
HDL: 82 mg/dL (ref >50)
LDL CHOLESTEROL (CALC): 116 mg/dL — AB
Non-HDL Cholesterol (Calc): 129 mg/dL (calc) (ref <130)
TRIGLYCERIDES: 52 mg/dL (ref <150)
Total CHOL/HDL Ratio: 2.6 (calc) (ref <5.0)

## 2017-12-06 LAB — HEMOGLOBIN A1C
Hgb A1c MFr Bld: 5.5 % of total Hgb (ref <5.7)
Mean Plasma Glucose: 111 (calc)
eAG (mmol/L): 6.2 (calc)

## 2017-12-06 LAB — HEPATITIS C ANTIBODY
Hepatitis C Ab: NONREACTIVE
SIGNAL TO CUT-OFF: 0.16 (ref <1.00)

## 2017-12-10 ENCOUNTER — Ambulatory Visit (INDEPENDENT_AMBULATORY_CARE_PROVIDER_SITE_OTHER): Payer: 59 | Admitting: Urology

## 2017-12-10 ENCOUNTER — Encounter: Payer: Self-pay | Admitting: Urology

## 2017-12-10 VITALS — BP 131/81 | HR 69 | Ht 68.0 in | Wt 158.0 lb

## 2017-12-10 DIAGNOSIS — N8110 Cystocele, unspecified: Secondary | ICD-10-CM | POA: Diagnosis not present

## 2017-12-10 DIAGNOSIS — N3946 Mixed incontinence: Secondary | ICD-10-CM | POA: Diagnosis not present

## 2017-12-10 LAB — URINALYSIS, COMPLETE
BILIRUBIN UA: NEGATIVE
GLUCOSE, UA: NEGATIVE
KETONES UA: NEGATIVE
Nitrite, UA: NEGATIVE
Protein, UA: NEGATIVE
SPEC GRAV UA: 1.015 (ref 1.005–1.030)
Urobilinogen, Ur: 0.2 mg/dL (ref 0.2–1.0)
pH, UA: 5.5 (ref 5.0–7.5)

## 2017-12-10 LAB — MICROSCOPIC EXAMINATION

## 2017-12-10 NOTE — Progress Notes (Signed)
12/10/2017 1:09 PM   Alyssa Bradford 1960/11/08 [id number]  Referring provider: Olin Hauser, DO [address] [place], [place] [address]  Chief Complaint  Patient presents with  . Bladder Prolapse    New patient    HPI: I was consulted to assist the patient's prolapse worsening over the last year.  She describes a bladder suspension with hysterectomy rectocele repair in 1995.  She feels vaginal bulging.  She does not do splinting maneuvers.  Sometimes she leaks with coughing and sneezing and perhaps bending and lifting.  She does not wear a pad during the day.  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  She now gets up 3 times a night and set up once.  Her flow was sometimes weak.  She voids every 2 hours during the day.  She had her first bladder infection last year.  She passes kidney stones.  She has had no neurologic issues  Modifying factors: There are no other modifying factors  Associated signs and symptoms: There are no other associated signs and symptoms Aggravating and relieving factors: There are no other aggravating or relieving factors Severity: Moderate Duration: Persistent   PMH: Past Medical History:  Diagnosis Date  . Breast cancer [facility]) 2011   right breast with lumpectomy and rad tx in [place]  . Breast cancer, stage 0, right 02/22/2009   DCIS, 1 cm, ER/PR positive, inferior margin 1-51mm; whole breast radiation. Tamoxifen x 3 years, then exemestane. [facility], [place], [place].   . Melanoma ([place])   . Nephrolithiasis     Surgical History: Past Surgical History:  Procedure Laterality Date  . ABDOMINAL HYSTERECTOMY  1994   partial - still has cervix, ovaries  . BLADDER SUSPENSION     approximately 25 yrs ago  . BREAST BIOPSY Left 2001   benign  . BREAST EXCISIONAL BIOPSY Right 2011   + with rad tx   . BREAST LUMPECTOMY  2011  . BREAST SURGERY  2001   BIOPSY  . COLONOSCOPY WITH PROPOFOL N/A 07/21/2015   Procedure: COLONOSCOPY WITH PROPOFOL;  Surgeon: Christene Lye, MD;  Location: [facility] ENDOSCOPY;  Service: Endoscopy;  Laterality: N/A;  . NM PET DX MELANOMA    . TONSILLECTOMY      Home Medications:  Allergies as of 12/10/2017      Reactions   Latex       Medication List        Accurate as of 12/10/17  1:09 PM. Always use your most recent med list.          meloxicam 15 MG tablet Commonly known as:  MOBIC Take 1 tablet (15 mg total) by mouth daily as needed for pain.       Allergies:  Allergies  Allergen Reactions  . Latex     Family History: Family History  Problem Relation Age of Onset  . Cancer Father        BLADDER /SKIN   . Heart disease Father        Stent placement & CABG x 3  . Heart attack Father 45  . Breast cancer Maternal Grandmother        70's  . Breast cancer Paternal Grandmother        60's    Social History:  reports that she has never smoked. She has never used smokeless tobacco. She reports that she drinks alcohol. She reports that she does not use drugs.  ROS: UROLOGY  Frequent Urination?: Yes Hard to postpone urination?: Yes Burning/pain with urination?: No Get up at night to urinate?: Yes Leakage of urine?: Yes Urine stream starts and stops?: Yes Trouble starting stream?: No Do you have to strain to urinate?: No Blood in urine?: No Urinary tract infection?: Yes Sexually transmitted disease?: No Injury to kidneys or bladder?: No Painful intercourse?: Yes Weak stream?: Yes Vaginal bleeding?: No Last menstrual period?: hysterectomy  Gastrointestinal Nausea?: No Vomiting?: No Indigestion/heartburn?: No Diarrhea?: No Constipation?: No  Constitutional Fever: No Night sweats?: No Weight loss?: No Fatigue?: No  Skin Skin rash/lesions?: No Itching?: No  Eyes Blurred vision?: No Double vision?: No  Ears/Nose/Throat Sore throat?: No Sinus problems?: No  Hematologic/Lymphatic Swollen glands?: No Easy bruising?:  No  Cardiovascular Leg swelling?: No Chest pain?: No  Respiratory Cough?: No Shortness of breath?: No  Endocrine Excessive thirst?: No  Musculoskeletal Back pain?: No Joint pain?: No  Neurological Headaches?: No Dizziness?: No  Psychologic Depression?: No Anxiety?: No  Physical Exam: BP 131/81   Pulse 69   Ht 5\' 8"  (1.727 m)   Wt 158 lb (71.7 kg)   BMI 24.02 kg/m   Constitutional:  Alert and oriented, No acute distress. HEENT: [place] AT, moist mucus membranes.  Trachea midline, no masses. Cardiovascular: No clubbing, cyanosis, or edema. Respiratory: Normal respiratory effort, no increased work of breathing. GI: Abdomen is soft, nontender, nondistended, no abdominal masses GU: On pelvic examination the patient has a moderate grade 3 cystocele with moderate central defect.  The cystocele approached and just reached the introitus.  Vaginal cuff descended from 8 or 9 cm to about 5 cm.  She had grade 2 hypermobility the bladder neck and negative cough test with a cystocele reduced.  She had no rectocele Skin: No rashes, bruises or suspicious lesions. Lymph: No cervical or inguinal adenopathy. Neurologic: Grossly intact, no focal deficits, moving all 4 extremities. Psychiatric: Normal mood and affect.  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  She is symptomatic prolapse.   Picture drawn.  If she ever had surgery she would likely best benefit from a transvaginal vault suspension with cystocele repair and graft.  Role of urodynamics discussed.  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 Bladder prolapse, female, acquired Cystocele Mixed incontinence - Urinalysis, Complete   No follow-ups on file.  Reece Packer, MD  [facility] [address] [place], [place] [address] [phone]

## 2017-12-12 ENCOUNTER — Other Ambulatory Visit: Payer: Self-pay | Admitting: Family Medicine

## 2017-12-12 DIAGNOSIS — M159 Polyosteoarthritis, unspecified: Secondary | ICD-10-CM

## 2017-12-12 DIAGNOSIS — E7800 Pure hypercholesterolemia, unspecified: Secondary | ICD-10-CM

## 2017-12-12 DIAGNOSIS — Z86000 Personal history of in-situ neoplasm of breast: Secondary | ICD-10-CM

## 2017-12-12 DIAGNOSIS — M150 Primary generalized (osteo)arthritis: Secondary | ICD-10-CM

## 2017-12-12 DIAGNOSIS — Z0000 Encounter for general adult medical examination without abnormal findings: Secondary | ICD-10-CM

## 2017-12-12 DIAGNOSIS — R7309 Other abnormal glucose: Secondary | ICD-10-CM

## 2017-12-17 ENCOUNTER — Encounter: Payer: Self-pay | Admitting: Urology

## 2017-12-17 ENCOUNTER — Ambulatory Visit (INDEPENDENT_AMBULATORY_CARE_PROVIDER_SITE_OTHER): Payer: 59 | Admitting: Urology

## 2017-12-17 VITALS — BP 134/79 | HR 87 | Ht 68.0 in | Wt 158.0 lb

## 2017-12-17 DIAGNOSIS — N3946 Mixed incontinence: Secondary | ICD-10-CM | POA: Diagnosis not present

## 2017-12-17 NOTE — Progress Notes (Signed)
12/17/2017 9:25 AM   Alyssa Bradford [birth year] [id number]  Referring provider: Olin Hauser, DO [address] [place], [place] [address]  Chief Complaint  Patient presents with  . Results    UDS    HPI: I was consulted to assist the patient's prolapse worsening over the last year.  She describes a bladder suspension with hysterectomy rectocele repair in 1995.  She feels vaginal bulging.  She does not do splinting maneuvers.  Sometimes she leaks with coughing and sneezing and perhaps bending and lifting.  She does not wear a pad during the day.  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  She now gets up 3 times a night and set up once.  Her flow was sometimes weak.  She voids every 2 hours during the day.  GU: On pelvic examination the patient has a moderate grade 3 cystocele with moderate central defect.  The cystocele approached and just reached the introitus.  Vaginal cuff descended from 8 or 9 cm to about 5 cm.  She had grade 2 hypermobility the bladder neck and negative cough test with a cystocele reduced.  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  She is symptomatic prolapse.  Picture drawn.  If she ever had surgery she would likely best benefit from a transvaginal vault suspension with cystocele repair and graft.  Role of urodynamics discussed.  We will try to move things along since she has the busy Christmas season at her and her husband store is very busy was hoping to have a surgery soon  Today The left and frequency stable Urodynamics: On uroflow the patient voided 258 mL with a maximum flow of 24 mils per second.  It was a prolonged interrupted pattern.  She emptied efficiently.  Residual was 50 mL.  Maximum bladder capacity was 870 mL.  Bladder was a bit hyposensitive.  She felt some pressure but was not uncomfortable.  She had low pressure instability reaching pressures of 4 cm of water.  She did not  leak.  With and without the prolapse reduced she did not have stress incontinence reaching a pressure of 98 cm of water.  She tried to void for a long time.  She generated a weak detrusor contraction of 4 cm of water but was never able to develop a flow.  She was straining as noted.  It was difficult to say she truly generate a contraction.  In private she voided 800 mL in the restroom.  Bladder neck descended 2 or 3 cm and she had a moderate cystocele noted.  The details of the urodynamics are signed and dictated  Picture drawn.  We talked about watchful waiting versus pessary versus transvaginal vault suspension and cystocele repair and graft.  Usual template was discussed.  Based upon the degree of incontinence and urodynamic findings and large capacity poorly contractile bladder I did not recommend a sling.  Persistent and worsening incontinence and sequelae discussed.  Mesh issues discussed     PMH: Past Medical History:  Diagnosis Date  . Breast cancer [facility]) 2011   right breast with lumpectomy and rad tx in [place]  . 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 Tamoxifen x 3 years, then exemestane. [facility], [place], [place].   . Melanoma ([place])   . Nephrolithiasis     Surgical  History: Past Surgical History:  Procedure Laterality Date  . ABDOMINAL HYSTERECTOMY  1994   partial - still has cervix, ovaries  . BLADDER SUSPENSION     approximately 25 yrs ago  . BREAST BIOPSY Left 2001   benign  . BREAST EXCISIONAL BIOPSY Right 2011   + with rad tx   . BREAST LUMPECTOMY  2011  . BREAST SURGERY  2001   BIOPSY  . COLONOSCOPY WITH PROPOFOL N/A 07/21/2015   Procedure: COLONOSCOPY WITH PROPOFOL;  Surgeon: Christene Lye, MD;  Location: [facility] ENDOSCOPY;  Service: Endoscopy;  Laterality: N/A;  . NM PET DX MELANOMA    . TONSILLECTOMY      Home Medications:  Allergies as of 12/17/2017       Reactions   Latex       Medication List        Accurate as of 12/17/17  9:25 AM. Always use your most recent med list.          meloxicam 15 MG tablet Commonly known as:  MOBIC Take 1 tablet (15 mg total) by mouth daily as needed for pain.       Allergies:  Allergies  Allergen Reactions  . Latex     Family History: Family History  Problem Relation Age of Onset  . Cancer Father        BLADDER /SKIN   . Heart disease Father        Stent placement & CABG x 3  . Heart attack Father 44  . Breast cancer Maternal Grandmother        70's  . Breast cancer Paternal Grandmother        60's    Social History:  reports that she has never smoked. She has never used smokeless tobacco. She reports that she drinks alcohol. She reports that she does not use drugs.  ROS: UROLOGY Frequent Urination?: No Hard to postpone urination?: No Burning/pain with urination?: No Get up at night to urinate?: No Leakage of urine?: No Urine stream starts and stops?: No Trouble starting stream?: No Do you have to strain to urinate?: No Blood in urine?: No Urinary tract infection?: No Sexually transmitted disease?: No Injury to kidneys or bladder?: No Painful intercourse?: No Weak stream?: No Currently pregnant?: No Vaginal bleeding?: No Last menstrual period?: n  Gastrointestinal Nausea?: No Vomiting?: No Indigestion/heartburn?: No Diarrhea?: No Constipation?: No  Constitutional Fever: No Night sweats?: No Weight loss?: No Fatigue?: No  Skin Skin rash/lesions?: No Itching?: No  Eyes Blurred vision?: No Double vision?: No  Ears/Nose/Throat Sore throat?: No Sinus problems?: No  Hematologic/Lymphatic Swollen glands?: No Easy bruising?: No  Cardiovascular Leg swelling?: No Chest pain?: No  Respiratory Cough?: No Shortness of breath?: No  Endocrine Excessive thirst?: No  Musculoskeletal Back pain?: No Joint pain?: No  Neurological Headaches?:  No Dizziness?: No  Psychologic Depression?: No Anxiety?: No  Physical Exam: BP 134/79   Pulse 87   Ht 5\' 8"  (1.727 m)   Wt 158 lb (71.7 kg)   BMI 24.02 kg/m   Constitutional:  Alert and oriented, No acute distress.   Laboratory Data: Lab Results  Component Value Date   WBC 4.1 12/05/2017   HGB 11.9 12/05/2017   HCT 36.4 12/05/2017   MCV 85.8 12/05/2017   PLT 233 12/05/2017    Lab Results  Component Value Date   CREATININE 0.73 12/05/2017    No results found for: PSA  No results found for: TESTOSTERONE  Lab Results  Component Value Date   HGBA1C 5.5 12/05/2017    Urinalysis    Component Value Date/Time   APPEARANCEUR Cloudy (A) 12/10/2017 1256   GLUCOSEU Negative 12/10/2017 1256   BILIRUBINUR Negative 12/10/2017 1256   PROTEINUR Negative 12/10/2017 1256   NITRITE Negative 12/10/2017 1256   LEUKOCYTESUR 2+ (A) 12/10/2017 1256    Pertinent Imaging:   Assessment & Plan: Patient is sexually active.  She has pressure and some discomfort with intercourse and she thinks it might be related.  We discussed how prolapse normally does not cause pain.  She does use lubricant for vaginal dryness.  She would like to proceed with surgery as soon as possible.  Picture was drawn  There are no diagnoses linked to this encounter.  No follow-ups on file.  Reece Packer, MD  [facility] [address] [place], [place] [address] [phone]

## 2017-12-18 ENCOUNTER — Other Ambulatory Visit: Payer: Self-pay | Admitting: Urology

## 2017-12-21 ENCOUNTER — Other Ambulatory Visit: Payer: Self-pay | Admitting: Urology

## 2018-01-16 NOTE — Progress Notes (Signed)
hgba1c 12-05-17 epic

## 2018-01-16 NOTE — H&P (Signed)
Chief Complaint  Patient presents with  . Results    UDS    HPI: I was consulted to assist the patient's prolapse worsening over the last year. She describes a bladder suspension with hysterectomy rectocele repair in 1995. She feels vaginal bulging. She does not do splinting maneuvers. Sometimes she leaks with coughing and sneezing and perhaps bending and lifting. She does not wear a pad during the day. 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  She now gets up 3 times a night and set up once. Her flow was sometimes weak. She voids every 2 hours during the day.  GU:On pelvic examination the patient has a moderate grade 3 cystocele with moderate central defect. The cystocele approached and just reached the introitus. Vaginal cuff descended from 8 or 9 cm to about 5 cm. She had grade 2 hypermobility the bladder neck and negative cough test with a cystocele reduced. 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 She is symptomatic prolapse. Picture drawn. If she ever had surgery she would likely best benefit from a transvaginal vault suspension with cystocele repair and graft. Role of urodynamics discussed.We will try to move things along since she has the busy Christmas season at her and her husband store is very busy was hoping to have a surgery soon  Today The left and frequency stable Urodynamics: On uroflow the patient voided 258 mL with a maximum flow of 24 mils per second.  It was a prolonged interrupted pattern.  She emptied efficiently.  Residual was 50 mL.  Maximum bladder capacity was 870 mL.  Bladder was a bit hyposensitive.  She felt some pressure but was not uncomfortable.  She had low pressure instability reaching pressures of 4 cm of water.  She did not leak.  With and without the prolapse reduced she did not have stress incontinence reaching a pressure of 98 cm of water.  She tried to void for  a long time.  She generated a weak detrusor contraction of 4 cm of water but was never able to develop a flow.  She was straining as noted.  It was difficult to say she truly generate a contraction.  In private she voided 800 mL in the restroom.  Bladder neck descended 2 or 3 cm and she had a moderate cystocele noted.  The details of the urodynamics are signed and dictated  Picture drawn.  We talked about watchful waiting versus pessary versus transvaginal vault suspension and cystocele repair and graft.  Usual template was discussed.  Based upon the degree of incontinence and urodynamic findings and large capacity poorly contractile bladder I did not recommend a sling.  Persistent and worsening incontinence and sequelae discussed.  Mesh issues discussed     PMH:     Past Medical History:  Diagnosis Date  . Breast cancer [facility]) 2011   right breast with lumpectomy and rad tx in [place]  . 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 Tamoxifen x 3 years, then exemestane. [facility], [place], [place].   . Melanoma ([place])   . Nephrolithiasis     Surgical History:      Past Surgical History:  Procedure Laterality Date  . ABDOMINAL HYSTERECTOMY  1994   partial - still has cervix, ovaries  . BLADDER SUSPENSION     approximately 25 yrs ago  .  BREAST BIOPSY Left 2001   benign  . BREAST EXCISIONAL BIOPSY Right 2011   + with rad tx   . BREAST LUMPECTOMY  2011  . BREAST SURGERY  2001   BIOPSY  . COLONOSCOPY WITH PROPOFOL N/A 07/21/2015   Procedure: COLONOSCOPY WITH PROPOFOL;  Surgeon: Christene Lye, MD;  Location: [facility] ENDOSCOPY;  Service: Endoscopy;  Laterality: N/A;  . NM PET DX MELANOMA    . TONSILLECTOMY      Home Medications:       Allergies as of 12/17/2017      Reactions   Latex            Medication List            Accurate as of 12/17/17   9:25 AM. Always use your most recent med list.           meloxicam 15 MG tablet Commonly known as:  MOBIC Take 1 tablet (15 mg total) by mouth daily as needed for pain.       Allergies:      Allergies  Allergen Reactions  . Latex     Family History:      Family History  Problem Relation Age of Onset  . Cancer Father        BLADDER /SKIN   . Heart disease Father        Stent placement & CABG x 3  . Heart attack Father 11  . Breast cancer Maternal Grandmother        70's  . Breast cancer Paternal Grandmother        60's    Social History:  reports that she has never smoked. She has never used smokeless tobacco. She reports that she drinks alcohol. She reports that she does not use drugs.  ROS: UROLOGY Frequent Urination?: No Hard to postpone urination?: No Burning/pain with urination?: No Get up at night to urinate?: No Leakage of urine?: No Urine stream starts and stops?: No Trouble starting stream?: No Do you have to strain to urinate?: No Blood in urine?: No Urinary tract infection?: No Sexually transmitted disease?: No Injury to kidneys or bladder?: No Painful intercourse?: No Weak stream?: No Currently pregnant?: No Vaginal bleeding?: No Last menstrual period?: n  Gastrointestinal Nausea?: No Vomiting?: No Indigestion/heartburn?: No Diarrhea?: No Constipation?: No  Constitutional Fever: No Night sweats?: No Weight loss?: No Fatigue?: No  Skin Skin rash/lesions?: No Itching?: No  Eyes Blurred vision?: No Double vision?: No  Ears/Nose/Throat Sore throat?: No Sinus problems?: No  Hematologic/Lymphatic Swollen glands?: No Easy bruising?: No  Cardiovascular Leg swelling?: No Chest pain?: No  Respiratory Cough?: No Shortness of breath?: No  Endocrine Excessive thirst?: No  Musculoskeletal Back pain?: No Joint pain?: No  Neurological Headaches?: No Dizziness?:  No  Psychologic Depression?: No Anxiety?: No  Physical Exam: BP 134/79   Pulse 87   Ht 5\' 8"  (1.727 m)   Wt 158 lb (71.7 kg)   BMI 24.02 kg/m   Constitutional:  Alert and oriented, No acute distress.   Laboratory Data: RecentLabs       Lab Results  Component Value Date   WBC 4.1 12/05/2017   HGB 11.9 12/05/2017   HCT 36.4 12/05/2017   MCV 85.8 12/05/2017   PLT 233 12/05/2017      RecentLabs       Lab Results  Component Value Date   CREATININE 0.73 12/05/2017      RecentLabs  No results found for: PSA  RecentLabs  No results found for: TESTOSTERONE    RecentLabs       Lab Results  Component Value Date   HGBA1C 5.5 12/05/2017      Urinalysis Labs(Brief)          Component Value Date/Time   APPEARANCEUR Cloudy (A) 12/10/2017 1256   GLUCOSEU Negative 12/10/2017 1256   BILIRUBINUR Negative 12/10/2017 1256   PROTEINUR Negative 12/10/2017 1256   NITRITE Negative 12/10/2017 1256   LEUKOCYTESUR 2+ (A) 12/10/2017 1256      Pertinent Imaging:   Assessment & Plan: Patient is sexually active.  She has pressure and some discomfort with intercourse and she thinks it might be related.  We discussed how prolapse normally does not cause pain.  She does use lubricant for vaginal dryness.  She would like to proceed with surgery as soon as possible.  Picture was drawn  After a thorough review of the management options for the patient's condition the patient  elected to proceed with surgical therapy as noted above. We have discussed the potential benefits and risks of the procedure, side effects of the proposed treatment, the likelihood of the patient achieving the goals of the procedure, and any potential problems that might occur during the procedure or recuperation. Informed consent has been obtained.

## 2018-01-16 NOTE — Patient Instructions (Addendum)
[facility]  01/16/2018   Your procedure is scheduled on: 01-22-18   Report to [facility] Main  Entrance   Report to admitting at 5:30AM    Call this number if you have problems the morning of surgery [phone]     Remember: Do not eat food or drink liquids :After Midnight. BRUSH YOUR TEETH MORNING OF SURGERY AND RINSE YOUR MOUTH OUT, NO CHEWING GUM CANDY OR MINTS.     Take these medicines the morning of surgery with A SIP OF WATER: none                                 You may not have any metal on your body including hair pins and              piercings  Do not wear jewelry, make-up, lotions, powders or perfumes, deodorant             Do not wear nail polish.  Do not shave  48 hours prior to surgery.             Do not bring valuables to the hospital. [place].  Contacts, dentures or bridgework may not be worn into surgery.  Leave suitcase in the car. After surgery it may be brought to your room.                 Please read over the following fact sheets you were given: _____________________________________________________________________             [facility] - Preparing for Surgery Before surgery, you can play an important role.  Because skin is not sterile, your skin needs to be as free of germs as possible.  You can reduce the number of germs on your skin by washing with CHG (chlorahexidine gluconate) soap before surgery.  CHG is an antiseptic cleaner which kills germs and bonds with the skin to continue killing germs even after washing. Please DO NOT use if you have an allergy to CHG or antibacterial soaps.  If your skin becomes reddened/irritated stop using the CHG and inform your nurse when you arrive at Short Stay. Do not shave (including legs and underarms) for at least 48 hours prior to the first CHG shower.  You may shave your face/neck. Please follow these instructions carefully:  1.   Shower with CHG Soap the night before surgery and the  morning of Surgery.  2.  If you choose to wash your hair, wash your hair first as usual with your  normal  shampoo.  3.  After you shampoo, rinse your hair and body thoroughly to remove the  shampoo.                           4.  Use CHG as you would any other liquid soap.  You can apply chg directly  to the skin and wash                       Gently with a scrungie or clean washcloth.  5.  Apply the CHG Soap to your body ONLY FROM THE NECK DOWN.   Do not use on face/  open                           Wound or open sores. Avoid contact with eyes, ears mouth and genitals (private parts).                       Wash face,  Genitals (private parts) with your normal soap.             6.  Wash thoroughly, paying special attention to the area where your surgery  will be performed.  7.  Thoroughly rinse your body with warm water from the neck down.  8.  DO NOT shower/wash with your normal soap after using and rinsing off  the CHG Soap.                9.  Pat yourself dry with a clean towel.            10.  Wear clean pajamas.            11.  Place clean sheets on your bed the night of your first shower and do not  sleep with pets. Day of Surgery : Do not apply any lotions/deodorants the morning of surgery.  Please wear clean clothes to the hospital/surgery center.  FAILURE TO FOLLOW THESE INSTRUCTIONS MAY RESULT IN THE CANCELLATION OF YOUR SURGERY PATIENT SIGNATURE_________________________________  NURSE SIGNATURE__________________________________  ________________________________________________________________________  WHAT IS A BLOOD TRANSFUSION? Blood Transfusion Information  A transfusion is the replacement of blood or some of its parts. Blood is made up of multiple cells which provide different functions.  Red blood cells carry oxygen and are used for blood loss replacement.  White blood cells fight against infection.  Platelets control  bleeding.  Plasma helps clot blood.  Other blood products are available for specialized needs, such as hemophilia or other clotting disorders. BEFORE THE TRANSFUSION  Who gives blood for transfusions?   Healthy volunteers who are fully evaluated to make sure their blood is safe. This is blood bank blood. Transfusion therapy is the safest it has ever been in the practice of medicine. Before blood is taken from a donor, a complete history is taken to make sure that person has no history of diseases nor engages in risky social behavior (examples are intravenous drug use or sexual activity with multiple partners). The donor's travel history is screened to minimize risk of transmitting infections, such as malaria. The donated blood is tested for signs of infectious diseases, such as HIV and hepatitis. The blood is then tested to be sure it is compatible with you in order to minimize the chance of a transfusion reaction. If you or a relative donates blood, this is often done in anticipation of surgery and is not appropriate for emergency situations. It takes many days to process the donated blood. RISKS AND COMPLICATIONS Although transfusion therapy is very safe and saves many lives, the main dangers of transfusion include:   Getting an infectious disease.  Developing a transfusion reaction. This is an allergic reaction to something in the blood you were given. Every precaution is taken to prevent this. The decision to have a blood transfusion has been considered carefully by your caregiver before blood is given. Blood is not given unless the benefits outweigh the risks. AFTER THE TRANSFUSION  Right after receiving a blood transfusion, you will usually feel much better and more energetic. This is especially true if your red blood  cells have gotten low (anemic). The transfusion raises the level of the red blood cells which carry oxygen, and this usually causes an energy increase.  The nurse  administering the transfusion will monitor you carefully for complications. HOME CARE INSTRUCTIONS  No special instructions are needed after a transfusion. You may find your energy is better. Speak with your caregiver about any limitations on activity for underlying diseases you may have. SEEK MEDICAL CARE IF:   Your condition is not improving after your transfusion.  You develop redness or irritation at the intravenous (IV) site. SEEK IMMEDIATE MEDICAL CARE IF:  Any of the following symptoms occur over the next 12 hours:  Shaking chills.  You have a temperature by mouth above 102 F (38.9 C), not controlled by medicine.  Chest, back, or muscle pain.  People around you feel you are not acting correctly or are confused.  Shortness of breath or difficulty breathing.  Dizziness and fainting.  You get a rash or develop hives.  You have a decrease in urine output.  Your urine turns a dark color or changes to pink, red, or brown. Any of the following symptoms occur over the next 10 days:  You have a temperature by mouth above 102 F (38.9 C), not controlled by medicine.  Shortness of breath.  Weakness after normal activity.  The white part of the eye turns yellow (jaundice).  You have a decrease in the amount of urine or are urinating less often.  Your urine turns a dark color or changes to pink, red, or brown. Document Released: 04/07/2000 Document Revised: 07/03/2011 Document Reviewed: 11/25/2007 [facility] Patient Information 2014 [place], [place].  _______________________________________________________________________

## 2018-01-17 ENCOUNTER — Encounter (HOSPITAL_COMMUNITY)
Admission: RE | Admit: 2018-01-17 | Discharge: 2018-01-17 | Disposition: A | Discharge status: Home / Self Care | Payer: 59 | Source: Ambulatory Visit | Attending: Urology | Admitting: Urology

## 2018-01-17 ENCOUNTER — Encounter (HOSPITAL_COMMUNITY): Payer: Self-pay

## 2018-01-17 ENCOUNTER — Other Ambulatory Visit: Payer: Self-pay

## 2018-01-17 DIAGNOSIS — Z01812 Encounter for preprocedural laboratory examination: Secondary | ICD-10-CM | POA: Insufficient documentation

## 2018-01-17 LAB — BASIC METABOLIC PANEL
ANION GAP: 9 (ref 5–15)
BUN: 16 mg/dL (ref 6–20)
CALCIUM: 9.1 mg/dL (ref 8.9–10.3)
CHLORIDE: 106 mmol/L (ref 98–111)
CO2: 29 mmol/L (ref 22–32)
Creatinine, Ser: 0.78 mg/dL (ref 0.44–1.00)
GFR calc Af Amer: >60 mL/min (ref >60)
GFR calc non Af Amer: >60 mL/min (ref >60)
GLUCOSE: 74 mg/dL (ref 70–99)
Potassium: 4 mmol/L (ref 3.5–5.1)
Sodium: 144 mmol/L (ref 135–145)

## 2018-01-17 LAB — CBC
HCT: 34 % — ABNORMAL LOW (ref 36.0–46.0)
HEMOGLOBIN: 11.2 g/dL — AB (ref 12.0–15.0)
MCH: 28.7 pg (ref 26.0–34.0)
MCHC: 32.9 g/dL (ref 30.0–36.0)
MCV: 87.2 fL (ref 78.0–100.0)
Platelets: 279 10*3/uL (ref 150–400)
RBC: 3.9 MIL/uL (ref 3.87–5.11)
RDW: 15.6 % — ABNORMAL HIGH (ref 11.5–15.5)
WBC: 4.4 10*3/uL (ref 4.0–10.5)

## 2018-01-17 LAB — ABO/RH: ABO/RH(D): O NEG

## 2018-01-17 LAB — PROTIME-INR
INR: 0.85
PROTHROMBIN TIME: 11.6 s (ref 11.4–15.2)

## 2018-01-22 ENCOUNTER — Other Ambulatory Visit: Payer: Self-pay

## 2018-01-22 ENCOUNTER — Ambulatory Visit (HOSPITAL_COMMUNITY): Payer: 59 | Admitting: Anesthesiology

## 2018-01-22 ENCOUNTER — Encounter (HOSPITAL_COMMUNITY): Payer: Self-pay

## 2018-01-22 ENCOUNTER — Observation Stay (HOSPITAL_COMMUNITY)
Admission: RE | Admit: 2018-01-22 | Discharge: 2018-01-23 | Disposition: A | Discharge status: Home / Self Care | Payer: 59 | Source: Ambulatory Visit | Attending: Urology | Admitting: Urology

## 2018-01-22 ENCOUNTER — Encounter (HOSPITAL_COMMUNITY)
Admission: RE | Disposition: A | Discharge status: Home / Self Care | Payer: Self-pay | Source: Ambulatory Visit | Attending: Urology

## 2018-01-22 DIAGNOSIS — Z8249 Family history of ischemic heart disease and other diseases of the circulatory system: Secondary | ICD-10-CM | POA: Diagnosis not present

## 2018-01-22 DIAGNOSIS — Z853 Personal history of malignant neoplasm of breast: Secondary | ICD-10-CM | POA: Diagnosis not present

## 2018-01-22 DIAGNOSIS — Z91040 Latex allergy status: Secondary | ICD-10-CM | POA: Insufficient documentation

## 2018-01-22 DIAGNOSIS — Z85820 Personal history of malignant melanoma of skin: Secondary | ICD-10-CM | POA: Diagnosis not present

## 2018-01-22 DIAGNOSIS — N814 Uterovaginal prolapse, unspecified: Secondary | ICD-10-CM | POA: Diagnosis present

## 2018-01-22 DIAGNOSIS — N3946 Mixed incontinence: Secondary | ICD-10-CM | POA: Insufficient documentation

## 2018-01-22 DIAGNOSIS — N993 Prolapse of vaginal vault after hysterectomy: Secondary | ICD-10-CM | POA: Diagnosis not present

## 2018-01-22 HISTORY — PX: CYSTOSCOPY: SHX5120

## 2018-01-22 HISTORY — PX: CYSTOCELE REPAIR: SHX163

## 2018-01-22 LAB — HEMOGLOBIN AND HEMATOCRIT, BLOOD
HCT: 31.6 % — ABNORMAL LOW (ref 36.0–46.0)
Hemoglobin: 10.6 g/dL — ABNORMAL LOW (ref 12.0–15.0)

## 2018-01-22 LAB — TYPE AND SCREEN
ABO/RH(D): O NEG
Antibody Screen: NEGATIVE

## 2018-01-22 SURGERY — COLPORRHAPHY, ANTERIOR, FOR CYSTOCELE REPAIR
Anesthesia: General

## 2018-01-22 MED ORDER — KETOROLAC TROMETHAMINE 30 MG/ML IJ SOLN
30.0000 mg | Freq: Once | INTRAMUSCULAR | Status: AC | PRN
  Administered 2018-01-22: 30 mg via INTRAVENOUS

## 2018-01-22 MED ORDER — PROMETHAZINE HCL 25 MG/ML IJ SOLN: 6.2500 mg | INTRAMUSCULAR | Status: DC | PRN

## 2018-01-22 MED ORDER — LIDOCAINE 2% (20 MG/ML) 5 ML SYRINGE
INTRAMUSCULAR | Status: DC | PRN
  Administered 2018-01-22: 100 mg via INTRAVENOUS

## 2018-01-22 MED ORDER — HYDROCODONE-ACETAMINOPHEN 5-325 MG PO TABS
1.0000 | ORAL_TABLET | ORAL | Status: DC | PRN
  Administered 2018-01-22: 1 via ORAL
  Filled 2018-01-22: qty 1

## 2018-01-22 MED ORDER — DEXAMETHASONE SODIUM PHOSPHATE 10 MG/ML IJ SOLN
INTRAMUSCULAR | Status: DC | PRN
  Administered 2018-01-22: 10 mg via INTRAVENOUS

## 2018-01-22 MED ORDER — LIDOCAINE-EPINEPHRINE (PF) 1 %-1:200000 IJ SOLN
INTRAMUSCULAR | Status: DC | PRN
  Administered 2018-01-22: 20 mL

## 2018-01-22 MED ORDER — PHENYLEPHRINE 40 MCG/ML (10ML) SYRINGE FOR IV PUSH (FOR BLOOD PRESSURE SUPPORT)
PREFILLED_SYRINGE | INTRAVENOUS | Status: DC | PRN
  Administered 2018-01-22 (×5): 80 ug via INTRAVENOUS

## 2018-01-22 MED ORDER — ALBUMIN HUMAN 5 % IV SOLN
INTRAVENOUS | Status: DC | PRN
  Administered 2018-01-22: 09:00:00 via INTRAVENOUS

## 2018-01-22 MED ORDER — LIDOCAINE HCL URETHRAL/MUCOSAL 2 % EX GEL
CUTANEOUS | Status: AC
  Filled 2018-01-22: qty 5

## 2018-01-22 MED ORDER — ACETAMINOPHEN 325 MG PO TABS: 650.0000 mg | ORAL_TABLET | ORAL | Status: DC | PRN

## 2018-01-22 MED ORDER — OXYCODONE HCL 5 MG PO TABS: 5.0000 mg | ORAL_TABLET | Freq: Once | ORAL | Status: DC | PRN

## 2018-01-22 MED ORDER — HYDROCODONE-ACETAMINOPHEN 5-325 MG PO TABS: 1.0000 | ORAL_TABLET | Freq: Four times a day (QID) | ORAL | 0 refills | Status: DC | PRN

## 2018-01-22 MED ORDER — CIPROFLOXACIN IN D5W 400 MG/200ML IV SOLN
400.0000 mg | INTRAVENOUS | Status: AC
  Administered 2018-01-22: 400 mg via INTRAVENOUS
  Filled 2018-01-22: qty 200

## 2018-01-22 MED ORDER — MIDAZOLAM HCL 2 MG/2ML IJ SOLN
INTRAMUSCULAR | Status: AC
  Filled 2018-01-22: qty 2

## 2018-01-22 MED ORDER — ALBUMIN HUMAN 5 % IV SOLN
INTRAVENOUS | Status: AC
  Filled 2018-01-22: qty 250

## 2018-01-22 MED ORDER — ROCURONIUM BROMIDE 10 MG/ML (PF) SYRINGE
PREFILLED_SYRINGE | INTRAVENOUS | Status: DC | PRN
  Administered 2018-01-22: 10 mg via INTRAVENOUS
  Administered 2018-01-22: 20 mg via INTRAVENOUS
  Administered 2018-01-22: 50 mg via INTRAVENOUS
  Administered 2018-01-22 (×2): 10 mg via INTRAVENOUS

## 2018-01-22 MED ORDER — ONDANSETRON HCL 4 MG/2ML IJ SOLN: 4.0000 mg | INTRAMUSCULAR | Status: DC | PRN

## 2018-01-22 MED ORDER — FLUORESCEIN SODIUM 10 % IV SOLN
INTRAVENOUS | Status: AC
  Filled 2018-01-22: qty 5

## 2018-01-22 MED ORDER — PHENAZOPYRIDINE HCL 200 MG PO TABS
200.0000 mg | ORAL_TABLET | ORAL | Status: AC
  Administered 2018-01-22: 200 mg via ORAL
  Filled 2018-01-22: qty 1

## 2018-01-22 MED ORDER — SODIUM CHLORIDE 0.9 % IV SOLN
INTRAVENOUS | Status: AC
  Filled 2018-01-22 (×2): qty 500000

## 2018-01-22 MED ORDER — INDIGOTINDISULFONATE SODIUM 8 MG/ML IJ SOLN
INTRAMUSCULAR | Status: AC
  Filled 2018-01-22: qty 5

## 2018-01-22 MED ORDER — CLINDAMYCIN PHOSPHATE 900 MG/50ML IV SOLN
900.0000 mg | INTRAVENOUS | Status: AC
  Administered 2018-01-22: 900 mg via INTRAVENOUS
  Filled 2018-01-22: qty 50

## 2018-01-22 MED ORDER — PHENYLEPHRINE 40 MCG/ML (10ML) SYRINGE FOR IV PUSH (FOR BLOOD PRESSURE SUPPORT)
PREFILLED_SYRINGE | INTRAVENOUS | Status: AC
  Filled 2018-01-22: qty 10

## 2018-01-22 MED ORDER — LIDOCAINE-EPINEPHRINE (PF) 1 %-1:200000 IJ SOLN
INTRAMUSCULAR | Status: AC
  Filled 2018-01-22: qty 60

## 2018-01-22 MED ORDER — FENTANYL CITRATE (PF) 100 MCG/2ML IJ SOLN
INTRAMUSCULAR | Status: AC
  Filled 2018-01-22: qty 2

## 2018-01-22 MED ORDER — CLINDAMYCIN PHOSPHATE 2 % VA CREA
TOPICAL_CREAM | VAGINAL | Status: DC | PRN
  Administered 2018-01-22: 1 via VAGINAL

## 2018-01-22 MED ORDER — ONDANSETRON HCL 4 MG/2ML IJ SOLN
INTRAMUSCULAR | Status: DC | PRN
  Administered 2018-01-22: 4 mg via INTRAVENOUS

## 2018-01-22 MED ORDER — 0.9 % SODIUM CHLORIDE (POUR BTL) OPTIME
TOPICAL | Status: DC | PRN
  Administered 2018-01-22: 1000 mL

## 2018-01-22 MED ORDER — FLUORESCEIN SODIUM 10 % IV SOLN
INTRAVENOUS | Status: DC | PRN
  Administered 2018-01-22: 100 mg via INTRAVENOUS

## 2018-01-22 MED ORDER — ROCURONIUM BROMIDE 10 MG/ML (PF) SYRINGE
PREFILLED_SYRINGE | INTRAVENOUS | Status: AC
  Filled 2018-01-22: qty 10

## 2018-01-22 MED ORDER — LACTATED RINGERS IV SOLN
INTRAVENOUS | Status: DC | PRN
  Administered 2018-01-22 (×2): via INTRAVENOUS

## 2018-01-22 MED ORDER — ONDANSETRON HCL 4 MG/2ML IJ SOLN
INTRAMUSCULAR | Status: AC
  Filled 2018-01-22: qty 2

## 2018-01-22 MED ORDER — SUCCINYLCHOLINE CHLORIDE 200 MG/10ML IV SOSY
PREFILLED_SYRINGE | INTRAVENOUS | Status: AC
  Filled 2018-01-22: qty 10

## 2018-01-22 MED ORDER — DEXTROSE-NACL 5-0.45 % IV SOLN
INTRAVENOUS | Status: DC
  Administered 2018-01-22 – 2018-01-23 (×2): via INTRAVENOUS

## 2018-01-22 MED ORDER — DIPHENHYDRAMINE HCL 12.5 MG/5ML PO ELIX: 12.5000 mg | ORAL_SOLUTION | Freq: Four times a day (QID) | ORAL | Status: DC | PRN

## 2018-01-22 MED ORDER — PROPOFOL 10 MG/ML IV BOLUS
INTRAVENOUS | Status: DC | PRN
  Administered 2018-01-22: 180 mg via INTRAVENOUS

## 2018-01-22 MED ORDER — HYDROMORPHONE HCL 1 MG/ML IJ SOLN
INTRAMUSCULAR | Status: AC
  Administered 2018-01-22: 15:00:00
  Filled 2018-01-22: qty 1

## 2018-01-22 MED ORDER — KETOROLAC TROMETHAMINE 30 MG/ML IJ SOLN
INTRAMUSCULAR | Status: AC
  Administered 2018-01-22: 15:00:00
  Filled 2018-01-22: qty 1

## 2018-01-22 MED ORDER — MIDAZOLAM HCL 2 MG/2ML IJ SOLN
INTRAMUSCULAR | Status: DC | PRN
  Administered 2018-01-22: 2 mg via INTRAVENOUS

## 2018-01-22 MED ORDER — OXYCODONE HCL 5 MG/5ML PO SOLN
5.0000 mg | Freq: Once | ORAL | Status: DC | PRN
  Filled 2018-01-22: qty 5

## 2018-01-22 MED ORDER — SUGAMMADEX SODIUM 200 MG/2ML IV SOLN
INTRAVENOUS | Status: DC | PRN
  Administered 2018-01-22: 150 mg via INTRAVENOUS

## 2018-01-22 MED ORDER — SUGAMMADEX SODIUM 200 MG/2ML IV SOLN
INTRAVENOUS | Status: AC
  Filled 2018-01-22: qty 2

## 2018-01-22 MED ORDER — DIPHENHYDRAMINE HCL 50 MG/ML IJ SOLN: 12.5000 mg | Freq: Four times a day (QID) | INTRAMUSCULAR | Status: DC | PRN

## 2018-01-22 MED ORDER — LIDOCAINE 2% (20 MG/ML) 5 ML SYRINGE
INTRAMUSCULAR | Status: AC
  Filled 2018-01-22: qty 5

## 2018-01-22 MED ORDER — HYDROMORPHONE HCL 1 MG/ML IJ SOLN
0.2500 mg | INTRAMUSCULAR | Status: DC | PRN
  Administered 2018-01-22: 0.5 mg via INTRAVENOUS

## 2018-01-22 MED ORDER — MORPHINE SULFATE (PF) 2 MG/ML IV SOLN: 2.0000 mg | INTRAVENOUS | Status: DC | PRN

## 2018-01-22 MED ORDER — DEXAMETHASONE SODIUM PHOSPHATE 10 MG/ML IJ SOLN
INTRAMUSCULAR | Status: AC
  Filled 2018-01-22: qty 1

## 2018-01-22 MED ORDER — SODIUM CHLORIDE 0.9 % IV SOLN
INTRAVENOUS | Status: DC | PRN
  Administered 2018-01-22: 500 mL

## 2018-01-22 MED ORDER — PROPOFOL 10 MG/ML IV BOLUS
INTRAVENOUS | Status: AC
  Filled 2018-01-22: qty 20

## 2018-01-22 MED ORDER — STERILE WATER FOR IRRIGATION IR SOLN
Status: DC | PRN
  Administered 2018-01-22: 2000 mL

## 2018-01-22 MED ORDER — FENTANYL CITRATE (PF) 100 MCG/2ML IJ SOLN
INTRAMUSCULAR | Status: DC | PRN
  Administered 2018-01-22: 25 ug via INTRAVENOUS
  Administered 2018-01-22: 50 ug via INTRAVENOUS
  Administered 2018-01-22: 25 ug via INTRAVENOUS
  Administered 2018-01-22 (×2): 50 ug via INTRAVENOUS

## 2018-01-22 MED ORDER — CLINDAMYCIN PHOSPHATE 2 % VA CREA
TOPICAL_CREAM | VAGINAL | Status: AC
  Filled 2018-01-22: qty 80

## 2018-01-22 SURGICAL SUPPLY — 62 items
ALLOGRAFT TUTOPLAST AXIS 6X12 (Tissue) IMPLANT
BAG URINE DRAINAGE (UROLOGICAL SUPPLIES) ×2 IMPLANT
BAG URO CATCHER STRL LF (MISCELLANEOUS) IMPLANT
BLADE SURG 15 STRL LF DISP TIS (BLADE) ×1 IMPLANT
BLADE SURG 15 STRL SS (BLADE) ×1
BRIEF STRETCH FOR OB PAD LRG (UNDERPADS AND DIAPERS) ×2 IMPLANT
CATH FOLEY 2W COUNCIL 5CC 18FR (CATHETERS) IMPLANT
CATH FOLEY 2WAY SLVR  5CC 14FR (CATHETERS) ×1
CATH FOLEY 2WAY SLVR 5CC 14FR (CATHETERS) ×1 IMPLANT
CLOTH BEACON ORANGE TIMEOUT ST (SAFETY) ×2 IMPLANT
COVER MAYO STAND STRL (DRAPES) ×2 IMPLANT
DEVICE CAPIO SLIM SINGLE (INSTRUMENTS) IMPLANT
DRAIN PENROSE .75X.25X12 SILI (WOUND CARE) ×2 IMPLANT
DRAIN PENROSE 18X1/4 LTX STRL (WOUND CARE) IMPLANT
DRAPE SHEET LG 3/4 BI-LAMINATE (DRAPES) IMPLANT
DRAPE UNDERBUTTOCKS STRL (DRAPE) ×2 IMPLANT
ELECT PENCIL ROCKER SW 15FT (MISCELLANEOUS) ×2 IMPLANT
GAUZE 4X4 16PLY RFD (DISPOSABLE) ×8 IMPLANT
GAUZE PACKING 1 X5 YD ST (GAUZE/BANDAGES/DRESSINGS) ×2 IMPLANT
GAUZE PACKING 2X5 YD STRL (GAUZE/BANDAGES/DRESSINGS) IMPLANT
GLOVE BIO SURGEON STRL SZ 6.5 (GLOVE) IMPLANT
GLOVE BIOGEL M STRL SZ7.5 (GLOVE) IMPLANT
GLOVE ECLIPSE 8.5 STRL (GLOVE) IMPLANT
GLOVE SURG SS PI 6.5 STRL IVOR (GLOVE) ×2 IMPLANT
GLOVE SURG SS PI 7.0 STRL IVOR (GLOVE) ×2 IMPLANT
GLOVE SURG SS PI 7.5 STRL IVOR (GLOVE) ×2 IMPLANT
GLOVE SURG SS PI 8.5 STRL IVOR (GLOVE) ×1
GLOVE SURG SS PI 8.5 STRL STRW (GLOVE) ×1 IMPLANT
GOWN STRL REUS W/ TWL LRG LVL3 (GOWN DISPOSABLE) ×2 IMPLANT
GOWN STRL REUS W/TWL LRG LVL3 (GOWN DISPOSABLE) ×2
GOWN STRL REUS W/TWL XL LVL3 (GOWN DISPOSABLE) ×2 IMPLANT
HOLDER FOLEY CATH W/STRAP (MISCELLANEOUS) IMPLANT
IV NS 1000ML (IV SOLUTION)
IV NS 1000ML BAXH (IV SOLUTION) IMPLANT
KIT BASIN OR (CUSTOM PROCEDURE TRAY) ×2 IMPLANT
NEEDLE HYPO 22GX1.5 SAFETY (NEEDLE) ×2 IMPLANT
NEEDLE MAYO 6 CRC TAPER PT (NEEDLE) ×2 IMPLANT
NS IRRIG 1000ML POUR BTL (IV SOLUTION) ×2 IMPLANT
PACK CYSTO (CUSTOM PROCEDURE TRAY) ×2 IMPLANT
PAD OB MATERNITY 4.3X12.25 (PERSONAL CARE ITEMS) ×2 IMPLANT
PLUG CATH AND CAP STER (CATHETERS) ×2 IMPLANT
RETRACTOR STAY HOOK 5MM (MISCELLANEOUS) ×2 IMPLANT
SHEET LAVH (DRAPES) ×2 IMPLANT
SUT CAPIO ETHIBPND (SUTURE) IMPLANT
SUT SILK 2 0 SH (SUTURE) ×2 IMPLANT
SUT VIC AB 0 CT1 27 (SUTURE)
SUT VIC AB 0 CT1 27XBRD ANTBC (SUTURE) IMPLANT
SUT VIC AB 2-0 CT1 27 (SUTURE) ×2
SUT VIC AB 2-0 CT1 27XBRD (SUTURE) ×2 IMPLANT
SUT VIC AB 2-0 SH 27 (SUTURE) ×3
SUT VIC AB 2-0 SH 27X BRD (SUTURE) ×3 IMPLANT
SUT VIC AB 3-0 SH 27 (SUTURE) ×3
SUT VIC AB 3-0 SH 27XBRD (SUTURE) ×3 IMPLANT
SUT VICRYL 0 UR6 27IN ABS (SUTURE) ×4 IMPLANT
SYR 10ML LL (SYRINGE) ×2 IMPLANT
TOWEL OR 17X26 10 PK STRL BLUE (TOWEL DISPOSABLE) ×2 IMPLANT
TOWEL OR NON WOVEN STRL DISP B (DISPOSABLE) ×2 IMPLANT
TUBING CONNECTING 10 (TUBING) ×2 IMPLANT
TUTOPLAST AXIS 6X12 (Tissue) IMPLANT
UNDERPAD 30X30 (UNDERPADS AND DIAPERS) ×2 IMPLANT
WATER STERILE IRR 1000ML POUR (IV SOLUTION) ×2 IMPLANT
YANKAUER SUCT BULB TIP 10FT TU (MISCELLANEOUS) ×2 IMPLANT

## 2018-01-22 NOTE — Anesthesia Postprocedure Evaluation (Signed)
Anesthesia Post Note  Patient: Alyssa Bradford  Procedure(s) Performed: ANTERIOR REPAIR (CYSTOCELE) (N/A ) CYSTOSCOPY (N/A )     Patient location during evaluation: PACU Anesthesia Type: General Level of consciousness: awake and alert Pain management: pain level controlled Vital Signs Assessment: post-procedure vital signs reviewed and stable Respiratory status: spontaneous breathing, nonlabored ventilation, respiratory function stable and patient connected to nasal cannula oxygen Cardiovascular status: blood pressure returned to baseline and stable Postop Assessment: no apparent nausea or vomiting Anesthetic complications: no    Last Vitals:  Vitals:   01/22/18 1217 01/22/18 1407  BP: 118/65 119/71  Pulse: 72 82  Resp: 16 16  Temp: (!) 36.3 C 36.6 C  SpO2: 100% 100%    Last Pain:  Vitals:   01/22/18 1407  TempSrc: Oral  PainSc:                  Demitri Kucinski S

## 2018-01-22 NOTE — Op Note (Signed)
Preoperative diagnosis: Cystocele and mild vault prolapse Postoperative diagnosis: Cystocele and mild vault prolapse Surgery: Cystocele repair and cystoscopy Surgeon: Dr. Nicki Reaper Rande Roylance Assistant: Debbrah Alar  The assistant was present and necessary for all steps of the operation described. The assistant played a critical role assisting during the operation  The patient was prepped and draped in usual fashion.  Extra care was taken with leg positioning to minimize the risk of compartment syndrome and neuropathy and deep vein thrombosis.  The patient had a moderate grade 2 cystocele with almost a golf ball size central defect.  She had a short anterior vaginal wall.  I took several minutes to place a 3-0 Vicryl at the vaginal cuff and I noted dimples.  I was generous in the cephalad marking.  I used my Allis clamp technique and made a T-shaped anterior vaginal wall incision from the apex to the bladder neck in the midline.  She had a narrow pelvis.  The dissection had to be done quite slowly because of her smaller anatomy.  I had instilled 20 cc of the lidocaine epinephrine mixture prior.  I mobilized well to the white line bilaterally.  I mobilized well for approximately a centimeter beyond my T-incision at the apex.  I felt that there was still not enough cystocele to repair because of length.  I marked the vaginal cuff approximately another centimeter and a half more cephalad.  I mobilized very well at the apex back to that suture as well as laterally off the sidewall to the white line.  She was so narrow I had to use my Metzenbaum scissors without my usual hand in the vagina.  Because of this and staying close to the vaginal epithelium there was a number of small buttonholes on both sides.  I was finally pleased with the mobilization of the cystocele and its attachments to the sidewall.  I was diligent in doing the anterior repair not imbricating the bladder neck with running 2-0 Vicryl on a CT1  needle.  I was very careful of not shortening her length.  I did a 2 layer repair.  I actually had changed to a longer retractor before making the final decision that my dissection was enough and mobilization was enough.  I then did the same maneuver after the first layer and was pleased with the imbrication.  A second layer was performed.  I repositioned the larger speculum and there is no question she had excellent vaginal length with excellent flattening of the anterior cystocele.  Her vaginal cuff was very well supported at the level of this now new apex.  I cystoscoped the patient.  Cystoscopically she had an excellent repair all the way back to the cephalad floor of the bladder.  There was no distortion of the ureteral orifices.  There is excellent reflux bilaterally.  There is no bladder injury  I only trimmed a few millimeters of tissue on both sides.  I closed the anterior vaginal wall with running 2-0 Vicryl on a CT1 needle.  I was careful with the apex not to narrow the patient.  2 small openings in the vaginal epithelium was closed with a 3-0 Vicryl interrupted figure of 8 suture.  Cystoscopically and anatomically the patient had an excellent repair.  Apex was very well supported.  She had very good length.  Blood loss was less than 50 mL.  Vaginal pack with clindamycin cream utilized.  Leg position was excellent.  Patient was taken to recovery.  Hopefully the patient  will reach her treatment goal.  From an anatomic standpoint it would have been very difficult to do a vault suspension because she was so narrow.  Having said that the apex was very well supported.

## 2018-01-22 NOTE — Progress Notes (Signed)
Looks good No leg pain Reviewed case See in am

## 2018-01-22 NOTE — Interval H&P Note (Signed)
History and Physical Interval Note:  01/22/2018 7:05 AM  Alyssa Bradford  has presented today for surgery, with the diagnosis of CYSTOCELE VAULT PROLAPSE  The various methods of treatment have been discussed with the patient and family. After consideration of risks, benefits and other options for treatment, the patient has consented to  Procedure(s): ANTERIOR REPAIR (CYSTOCELE) (N/A) VAGINAL VAULT SUSPENSION WITH GRAFT (N/A) CYSTOSCOPY (N/A) as a surgical intervention .  The patient's history has been reviewed, patient examined, no change in status, stable for surgery.  I have reviewed the patient's chart and labs.  Questions were answered to the patient's satisfaction.     [name]

## 2018-01-22 NOTE — Anesthesia Preprocedure Evaluation (Signed)
Anesthesia Evaluation  Patient identified by MRN, date of birth, ID band Patient awake    Reviewed: Allergy & Precautions, NPO status , Patient's Chart, lab work & pertinent test results  Airway Mallampati: II  TM Distance: >3 FB Neck ROM: Full    Dental no notable dental hx.    Pulmonary neg pulmonary ROS,    Pulmonary exam normal breath sounds clear to auscultation       Cardiovascular negative cardio ROS Normal cardiovascular exam Rhythm:Regular Rate:Normal     Neuro/Psych negative neurological ROS  negative psych ROS   GI/Hepatic negative GI ROS, Neg liver ROS,   Endo/Other  negative endocrine ROS  Renal/GU negative Renal ROS  negative genitourinary   Musculoskeletal negative musculoskeletal ROS (+)   Abdominal   Peds negative pediatric ROS (+)  Hematology negative hematology ROS (+)   Anesthesia Other Findings   Reproductive/Obstetrics negative OB ROS                             Anesthesia Physical Anesthesia Plan  ASA: I  Anesthesia Plan: General   Post-op Pain Management:    Induction: Intravenous  PONV Risk Score and Plan: 3 and Ondansetron, Dexamethasone, Treatment may vary due to age or medical condition and Midazolam  Airway Management Planned: Oral ETT  Additional Equipment:   Intra-op Plan:   Post-operative Plan: Extubation in OR  Informed Consent: I have reviewed the patients History and Physical, chart, labs and discussed the procedure including the risks, benefits and alternatives for the proposed anesthesia with the patient or authorized representative who has indicated his/her understanding and acceptance.   Dental advisory given  Plan Discussed with: CRNA and Surgeon  Anesthesia Plan Comments:         Anesthesia Quick Evaluation

## 2018-01-22 NOTE — Anesthesia Procedure Notes (Signed)
Procedure Name: Intubation Date/Time: 01/22/2018 7:46 AM Performed by: Dione Booze, CRNA Pre-anesthesia Checklist: Suction available, Patient being monitored, Emergency Drugs available and Patient identified Patient Re-evaluated:Patient Re-evaluated prior to induction Oxygen Delivery Method: Circle system utilized Preoxygenation: Pre-oxygenation with 100% oxygen Induction Type: IV induction Ventilation: Mask ventilation without difficulty Laryngoscope Size: Mac and 4 Grade View: Grade I Tube type: Oral Tube size: 7.5 mm Number of attempts: 1 Airway Equipment and Method: Stylet Placement Confirmation: ETT inserted through vocal cords under direct vision,  positive ETCO2 and breath sounds checked- equal and bilateral Secured at: 22 cm Tube secured with: Tape Dental Injury: Teeth and Oropharynx as per pre-operative assessment  Comments: Preop, small chip rt upper front tooth.

## 2018-01-22 NOTE — Transfer of Care (Signed)
Immediate Anesthesia Transfer of Care Note  Patient: Alyssa Bradford  Procedure(s) Performed: ANTERIOR REPAIR (CYSTOCELE) (N/A ) CYSTOSCOPY (N/A )  Patient Location: PACU  Anesthesia Type:General  Level of Consciousness: awake and patient cooperative  Airway & Oxygen Therapy: Patient Spontanous Breathing and Patient connected to face mask oxygen  Post-op Assessment: Report given to RN and Post -op Vital signs reviewed and stable  Post vital signs: Reviewed and stable  Last Vitals:  Vitals Value Taken Time  BP 132/69 01/22/2018 11:02 AM  Temp    Pulse 85 01/22/2018 11:03 AM  Resp 11 01/22/2018 11:03 AM  SpO2 100 % 01/22/2018 11:03 AM  Vitals shown include unvalidated device data.  Last Pain:  Vitals:   01/22/18 0606  TempSrc: Oral  PainSc:          Complications: No apparent anesthesia complications

## 2018-01-23 DIAGNOSIS — N993 Prolapse of vaginal vault after hysterectomy: Secondary | ICD-10-CM | POA: Diagnosis not present

## 2018-01-23 LAB — BASIC METABOLIC PANEL
Anion gap: 9 (ref 5–15)
BUN: 13 mg/dL (ref 6–20)
CO2: 27 mmol/L (ref 22–32)
Calcium: 9.3 mg/dL (ref 8.9–10.3)
Chloride: 107 mmol/L (ref 98–111)
Creatinine, Ser: 0.72 mg/dL (ref 0.44–1.00)
GFR calc Af Amer: >60 mL/min (ref >60)
GLUCOSE: 112 mg/dL — AB (ref 70–99)
POTASSIUM: 4.2 mmol/L (ref 3.5–5.1)
Sodium: 143 mmol/L (ref 135–145)

## 2018-01-23 LAB — HEMOGLOBIN AND HEMATOCRIT, BLOOD
HCT: 31 % — ABNORMAL LOW (ref 36.0–46.0)
Hemoglobin: 10 g/dL — ABNORMAL LOW (ref 12.0–15.0)

## 2018-01-23 NOTE — Progress Notes (Signed)
Pt voided 300cc, post void residual 38cc.  Notified MD and pt ready for discharge. Andre Lefort

## 2018-01-23 NOTE — Discharge Summary (Signed)
Date of admission: 01/22/2018  Date of discharge: 01/23/2018  Admission diagnosis: Midline cystoclele  Discharge diagnosis: Midline Cystocele  Secondary diagnoses: Mild vault prolapse  History and Physical: For full details, please see admission history and physical. Briefly, Alyssa Bradford is a 57 y.o. year old patient with the above diagnsosi.   Hospital Course: Cystocele repair and graft with uneventful post op course  Laboratory values:  Recent Labs    01/22/18 1542 01/23/18 0459  HGB 10.6* 10.0*  HCT 31.6* 31.0*   Recent Labs    01/23/18 0459  CREATININE 0.72    Disposition: Home  Discharge instruction: The patient was instructed to be ambulatory but told to refrain from heavy lifting, strenuous activity, or driving. Detailed  Discharge medications:  Allergies as of 01/23/2018      Reactions   Latex Rash   Contact dermatitis      Medication List    STOP taking these medications   meloxicam 15 MG tablet Commonly known as:  MOBIC     TAKE these medications   HYDROcodone-acetaminophen 5-325 MG tablet Commonly known as:  NORCO/VICODIN Take 1-2 tablets by mouth every 6 (six) hours as needed for moderate pain.   OVER THE COUNTER MEDICATION Take 1 tablet by mouth at bedtime as needed (insomnia). [employer] Sleep Aid       Followup:  Follow-up Information    Jamyria Ozanich, Nicki Reaper, MD Follow up.   Specialty:  Urology Contact information: [address] [place] [address] [phone]

## 2018-01-23 NOTE — Discharge Instructions (Signed)
I have reviewed discharge instructions in detail with the patient. They will follow-up with me or their physician as scheduled. My nurse will also be calling the patients as per protocol. As discussed with Dr. Matilde Sprang.  You may resume mobic, aleve, advil, aspirin, vitamins, and supplements 7 days after surgery.

## 2018-01-24 ENCOUNTER — Encounter (HOSPITAL_COMMUNITY): Payer: Self-pay | Admitting: Urology

## 2018-02-05 ENCOUNTER — Other Ambulatory Visit: Payer: Self-pay | Admitting: Family Medicine

## 2018-02-05 DIAGNOSIS — G8929 Other chronic pain: Secondary | ICD-10-CM

## 2018-02-05 DIAGNOSIS — M150 Primary generalized (osteo)arthritis: Principal | ICD-10-CM

## 2018-02-05 DIAGNOSIS — M159 Polyosteoarthritis, unspecified: Secondary | ICD-10-CM

## 2018-02-05 DIAGNOSIS — M25552 Pain in left hip: Secondary | ICD-10-CM

## 2018-02-11 ENCOUNTER — Ambulatory Visit (INDEPENDENT_AMBULATORY_CARE_PROVIDER_SITE_OTHER): Payer: 59 | Admitting: Urology

## 2018-02-11 ENCOUNTER — Encounter: Payer: Self-pay | Admitting: Urology

## 2018-02-11 VITALS — BP 148/86 | HR 78 | Ht 68.0 in | Wt 163.1 lb

## 2018-02-11 DIAGNOSIS — N3946 Mixed incontinence: Secondary | ICD-10-CM

## 2018-02-11 DIAGNOSIS — N8111 Cystocele, midline: Secondary | ICD-10-CM

## 2018-02-11 NOTE — Progress Notes (Signed)
02/11/2018 10:15 AM   Conya Walther [birth year] [id number]  Referring provider: Olin Hauser, DO [address] [place],  [address]  No chief complaint on file.   HPI: On October 1 the patient had a cystocele repair.  She had a very narrow pelvis.  I read my note in detail.  There was a lot of careful mobilization especially based upon her difficult anatomy.  I was very pleased with the anterior repair and the cuff was very well supported intraoperatively.  Today The patient says she is doing very well.  She was having some spotting until this weekend.  She still thinks with a sneeze she can leak some.  She said it was the same as the preoperative status.  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  She had an 870 mL bladder.  She had low pressure instability.  She did not leak with a pressure of 98 cm of water.  Based upon preoperative severity of incontinence and poorly contractile large capacity bladder sling was not ordered  On pelvic examination she had exceptionally good length and excellent support anteriorly and the incision line healing well.  She had appropriate hypermobility of the bladder neck with no stress incontinence   PMH: Past Medical History:  Diagnosis Date  . Breast cancer [facility]) 2011   right breast with lumpectomy and rad tx in [place]  . 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 Tamoxifen x 3 years, then exemestane. [facility], [place], [place].   . Melanoma ([place])   . Nephrolithiasis     Surgical History: Past Surgical History:  Procedure Laterality Date  . ABDOMINAL HYSTERECTOMY  1994   partial - still has cervix, ovaries  . BLADDER SUSPENSION     approximately 25 yrs ago  . BREAST BIOPSY Left 2001   benign  . BREAST EXCISIONAL BIOPSY Right 2011   + with rad tx   . BREAST LUMPECTOMY  2011  .  BREAST SURGERY  2001   BIOPSY  . COLONOSCOPY WITH PROPOFOL N/A 07/21/2015   Procedure: COLONOSCOPY WITH PROPOFOL;  Surgeon: Christene Lye, MD;  Location: [facility] ENDOSCOPY;  Service: Endoscopy;  Laterality: N/A;  . CYSTOCELE REPAIR N/A 01/22/2018   Procedure: ANTERIOR REPAIR (CYSTOCELE);  Surgeon: Bjorn Loser, MD;  Location: [facility] ORS;  Service: Urology;  Laterality: N/A;  . CYSTOSCOPY N/A 01/22/2018   Procedure: CYSTOSCOPY;  Surgeon: Bjorn Loser, MD;  Location: [facility] ORS;  Service: Urology;  Laterality: N/A;  . NM PET DX MELANOMA    . TONSILLECTOMY      Home Medications:  Allergies as of 02/11/2018      Reactions   Latex Rash   Contact dermatitis      Medication List        Accurate as of 02/11/18 10:15 AM. Always use your most recent med list.          HYDROcodone-acetaminophen 5-325 MG tablet Commonly known as:  NORCO/VICODIN Take 1-2 tablets by mouth every 6 (six) hours as needed for moderate pain.   meloxicam 15 MG tablet Commonly known as:  MOBIC TAKE 1 TABLET (15 MG TOTAL) BY MOUTH DAILY AS NEEDED FOR PAIN.   OVER THE COUNTER MEDICATION Take 1 tablet by mouth at bedtime as needed (insomnia). [employer] Sleep Aid       Allergies:  Allergies  Allergen Reactions  . Latex Rash  Contact dermatitis    Family History: Family History  Problem Relation Age of Onset  . Cancer Father        BLADDER /SKIN   . Heart disease Father        Stent placement & CABG x 3  . Heart attack Father 34  . Breast cancer Maternal Grandmother        70's  . Breast cancer Paternal Grandmother        60's    Social History:  reports that she has never smoked. She has never used smokeless tobacco. She reports that she drinks alcohol. She reports that she does not use drugs.  ROS:                                        Physical Exam: There were no vitals taken for this visit.  Constitutional:  Alert and oriented, No acute  distress.   Laboratory Data: Lab Results  Component Value Date   WBC 4.4 01/17/2018   HGB 10.0 (L) 01/23/2018   HCT 31.0 (L) 01/23/2018   MCV 87.2 01/17/2018   PLT 279 01/17/2018    Lab Results  Component Value Date   CREATININE 0.72 01/23/2018    No results found for: PSA  No results found for: TESTOSTERONE  Lab Results  Component Value Date   HGBA1C 5.5 12/05/2017    Urinalysis    Component Value Date/Time   APPEARANCEUR Cloudy (A) 12/10/2017 1256   GLUCOSEU Negative 12/10/2017 1256   BILIRUBINUR Negative 12/10/2017 1256   PROTEINUR Negative 12/10/2017 1256   NITRITE Negative 12/10/2017 1256   LEUKOCYTESUR 2+ (A) 12/10/2017 1256    Pertinent Imaging:   Assessment & Plan: Very pleased with progress reassess in 10 weeks  1. Mixed incontinence  - Urinalysis, Complete   No follow-ups on file.  Reece Packer, MD  [facility] [address] [place], [place] [address] [phone]

## 2018-03-07 ENCOUNTER — Encounter: Payer: Self-pay | Admitting: Family Medicine

## 2018-03-07 ENCOUNTER — Ambulatory Visit (INDEPENDENT_AMBULATORY_CARE_PROVIDER_SITE_OTHER): Payer: 59 | Admitting: Family Medicine

## 2018-03-07 VITALS — BP 120/78 | HR 66 | Temp 97.7°F | Resp 15 | Ht 68.0 in | Wt 161.8 lb

## 2018-03-07 DIAGNOSIS — R195 Other fecal abnormalities: Secondary | ICD-10-CM

## 2018-03-07 DIAGNOSIS — N3001 Acute cystitis with hematuria: Secondary | ICD-10-CM | POA: Diagnosis not present

## 2018-03-07 DIAGNOSIS — R300 Dysuria: Secondary | ICD-10-CM

## 2018-03-07 DIAGNOSIS — R109 Unspecified abdominal pain: Secondary | ICD-10-CM | POA: Diagnosis not present

## 2018-03-07 LAB — POCT URINALYSIS DIPSTICK
BILIRUBIN UA: NEGATIVE
Glucose, UA: NEGATIVE
KETONES UA: NEGATIVE
Leukocytes, UA: NEGATIVE
Nitrite, UA: NEGATIVE
Odor: NEGATIVE
PH UA: 7 (ref 5.0–8.0)
Protein, UA: NEGATIVE
Spec Grav, UA: <=1.005 — AB (ref 1.010–1.025)
UROBILINOGEN UA: 1 U/dL

## 2018-03-07 MED ORDER — DICYCLOMINE HCL 10 MG PO CAPS: 10.0000 mg | ORAL_CAPSULE | Freq: Three times a day (TID) | ORAL | 0 refills | Status: DC

## 2018-03-07 NOTE — Patient Instructions (Addendum)
Thank you for coming to the office today.  1. You have a Urinary Tract Infection - this is very common, your symptoms are reassuring and you should get better within 1 week on the antibiotics - Start Keflex 500mg  3 times daily for next 7 days, complete entire course, even if feeling better - We sent urine for a culture, we will call you within next few days if we need to change antibiotics - Please drink plenty of fluids, improve hydration over next 1 week  Take DIcyclomine as needed with meal or bedtime up to 4 times a day for abdominal cramping if returns.  If symptoms worsening, developing nausea / vomiting, worsening back pain, fevers / chills / sweats, then please return for re-evaluation sooner.  To prevent bladder and kidney infections...   Wipe front to back after using the restroom   Drink enough water to keep your pee clear to pale yellow  If you think you are getting another bladder infection, start drinking cranberry juice and come see [place] so we can check the urine.  Please schedule a Follow-up Appointment to: Return in about 1 week (around 03/14/2018), or if symptoms worsen or fail to improve, for UTI / abd cramping.  If you have any other questions or concerns, please feel free to call the office or send a message through [place]. You may also schedule an earlier appointment if necessary.  Additionally, you may be receiving a survey about your experience at our office within a few days to 1 week by e-mail or mail. We value your feedback.  Nobie Putnam, DO [place]

## 2018-03-07 NOTE — Progress Notes (Signed)
Subjective:    Patient ID: Alyssa Bradford, female    DOB: [birth year], 57 y.o.   MRN: [id number]  Alyssa Bradford is a 57 y.o. female presenting on 03/07/2018 for Abdominal Cramping (started last night with reoccurent bowel movements)  Patient presents for a same day appointment.  HPI   DYSURIA / URINARY URGENCY / ABDOMINAL CRAMPING Reports new symptoms onset, last night woke up with urinary urgency and burning, still has urinary burning and some strong odor - Then later had significant severe abdominal sharp pain and cramping, bowel movements with soft without diarrhea x 3 with gradual improvement. She took Motrin x 3 at 4am today with mild relief. - History of bladder surgery with cystocele and mild vault prolapse repair on 01/22/18 Dr Matilde Sprang, with improved incontinence symptoms, at follow-up 02/11/18, she still admits some spotting of blood post op. - Prior history of UTI rarely, one in 2018, otherwise does not have often - Admits nausea associated with cramping - Denies any fevers, chills, sweats, vomiting, dark stool or blood in stool   Depression screen [facility] 2/9 12/05/2017 11/09/2017 02/22/2015  Decreased Interest 0 0 0  Down, Depressed, Hopeless 0 0 0  PHQ - 2 Score 0 0 0    Social History   Tobacco Use  . Smoking status: Never Smoker  . Smokeless tobacco: Never Used  Substance Use Topics  . Alcohol use: Yes    Comment: occasional  . Drug use: No    Review of Systems Per HPI unless specifically indicated above     Objective:    BP 120/78 (BP Location: Left Arm, Patient Position: Sitting, Cuff Size: Normal)   Pulse 66   Temp 97.7 F (36.5 C) (Oral)   Resp 15   Ht 5\' 8"  (1.727 m)   Wt 161 lb 12.8 oz (73.4 kg)   SpO2 97%   BMI 24.60 kg/m   Wt Readings from Last 3 Encounters:  03/07/18 161 lb 12.8 oz (73.4 kg)  02/11/18 163 lb 1.6 oz (74 kg)  01/22/18 165 lb 9.1 oz (75.1 kg)    Physical Exam  Constitutional: She is oriented to person, place, and time. She appears  well-developed and well-nourished. No distress.  Well-appearing, comfortable, cooperative  HENT:  Head: Normocephalic and atraumatic.  Mouth/Throat: Oropharynx is clear and moist.  Eyes: Conjunctivae are normal. Right eye exhibits no discharge. Left eye exhibits no discharge.  Cardiovascular: Normal rate.  Pulmonary/Chest: Effort normal.  Abdominal: Soft. Bowel sounds are normal. She exhibits no distension. There is no tenderness. There is no guarding.  Musculoskeletal: She exhibits no edema.  Neurological: She is alert and oriented to person, place, and time.  Skin: Skin is warm and dry. No rash noted. She is not diaphoretic. No erythema.  Psychiatric: She has a normal mood and affect. Her behavior is normal.  Well groomed, good eye contact, normal speech and thoughts  Nursing note and vitals reviewed.  Results for orders placed or performed in visit on 03/07/18  POCT urinalysis dipstick  Result Value Ref Range   Color, UA Yellow    Clarity, UA Clear    Glucose, UA Negative Negative   Bilirubin, UA Neg    Ketones, UA Neg    Spec Grav, UA <=1.005 (A) 1.010 - 1.025   Blood, UA Large 3+    pH, UA 7.0 5.0 - 8.0   Protein, UA Negative Negative   Urobilinogen, UA 1.0 0.2 or 1.0 E.U./dL   Nitrite, UA Neg    Leukocytes,  UA Negative Negative   Appearance Clear    Odor Neg       Assessment & Plan:   Problem List Items Addressed This Visit    None    Visit Diagnoses    Acute cystitis with hematuria    -  Primary   Relevant Orders   POCT urinalysis dipstick (Completed)   Urine Culture   Abdominal cramping       Relevant Medications   dicyclomine (BENTYL) 10 MG capsule   Loose stools       Dysuria       Relevant Orders   POCT urinalysis dipstick (Completed)   Urine Culture      Clinically consistent with possible UTI. Limited history of UTI. Has had recent bladder procedure >1 month ago, has had uncomplicated post op course, except some spotting / bleeding - likely accounts  for the +RBC on UA.  No concern for pyelo today (no systemic symptoms, neg fever, back pain, n/v).  Uncertain exact etiology with abdominal cramping pain and loose stools, otherwise benign abdomen today, asymptomatic without any abdominal pain. May be viral.  Plan: 1. UA not entirely suggestive of UTI except +blood likely from post op 2. Ordered Urine culture 3. Reviewed results UA w/ patient after visit and agree to mutually hold antibiotic until urine culture results - START Dicyclomine PRN abdominal cramping 4. Improve PO hydration, diet 5. RTC if no improvement 1-2 weeks, red flags given to return sooner   Meds ordered this encounter  Medications  . dicyclomine (BENTYL) 10 MG capsule    Sig: Take 1 capsule (10 mg total) by mouth 4 (four) times daily -  before meals and at bedtime. As needed for abdominal cramping    Dispense:  30 capsule    Refill:  0    Follow up plan: Return in about 1 week (around 03/14/2018), or if symptoms worsen or fail to improve, for UTI / abd cramping.  Nobie Putnam, [facility] 03/07/2018, 10:14 AM

## 2018-03-08 LAB — URINE CULTURE
MICRO NUMBER: 91373633
Result:: NO GROWTH
SPECIMEN QUALITY: ADEQUATE

## 2018-04-22 ENCOUNTER — Ambulatory Visit (INDEPENDENT_AMBULATORY_CARE_PROVIDER_SITE_OTHER): Payer: 59 | Admitting: Urology

## 2018-04-22 ENCOUNTER — Encounter: Payer: Self-pay | Admitting: Urology

## 2018-04-22 VITALS — BP 161/77 | HR 80 | Ht 68.0 in | Wt 169.0 lb

## 2018-04-22 DIAGNOSIS — N8111 Cystocele, midline: Secondary | ICD-10-CM

## 2018-04-22 DIAGNOSIS — G8929 Other chronic pain: Secondary | ICD-10-CM

## 2018-04-22 DIAGNOSIS — M25552 Pain in left hip: Secondary | ICD-10-CM

## 2018-04-22 DIAGNOSIS — M159 Polyosteoarthritis, unspecified: Secondary | ICD-10-CM

## 2018-04-22 DIAGNOSIS — M150 Primary generalized (osteo)arthritis: Secondary | ICD-10-CM

## 2018-04-22 MED ORDER — MELOXICAM 15 MG PO TABS: 15.0000 mg | ORAL_TABLET | Freq: Every day | ORAL | 1 refills | Status: DC | PRN

## 2018-04-22 NOTE — Addendum Note (Signed)
Addended by: Donalee Citrin on: 04/22/2018 02:55 PM   Modules accepted: Orders

## 2018-04-22 NOTE — Progress Notes (Signed)
04/22/2018 1:50 PM   Alyssa Bradford [birth year] [id number]  Referring provider: Olin Hauser, DO [address] [place], [place] [address]  Chief Complaint  Patient presents with  . Follow-up    HPI: The patient says she is doing very well.  She was having some spotting until this weekend.  She still thinks with a sneeze she can leak some.  She said it was the same as the preoperative status.  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  She had an 870 mL bladder.  She had low pressure instability.  She did not leak with a pressure of 98 cm of water.  Based upon preoperative severity of incontinence and poorly contractile large capacity bladder sling was not ordered  On pelvic examination she had exceptionally good length and excellent support anteriorly and the incision line healing well.  She had appropriate hypermobility of the bladder neck with no stress incontinence   Today Frequency is stable She rarely leaks with a sneeze.  She said that intercourse is very uncomfortable along the sides and it was not like this prior to surgery.  She always has used lubricant for vaginal dryness.  I reviewed by operative note and technically the operation was quite challenging because she was so narrow.  I was very pleased with the repair had a modified my technique.  I noted only removing a few millimeters on each side of the vaginal epithelium.  No mesh or foreign body was utilized.  I was very careful not to narrow the apex and she was so narrow I could not simultaneously place my second hand in the vagina when I was using dissecting scissors.  When I examined the patient she had excellent length and a very small grade 1 cystocele within normal limits following such a repair and normal tissue posteriorly.  She had good vaginal volume with no trigger points or tenderness.  At the level of the introitus her levator muscles were not tender just inside  the introitus.  At the labia just outside the introitus at 5 and 7:00 she was tender almost like there was an elastic band.  Having said that visually the anatomy looked normal not inflamed and not narrow.  At 6:00 again the tissue was normal.  This was the reproducible tender area and she said it is not up inside and agreed inside felt normal  A picture was drawn.  The role of physical therapy locally and in [place] discussed.  The role of using lots of lubricant discussed.  It is difficult to explain the findings since she did not have a posterior repair or perineal body repair.  There was no dissection performed at the level of the introitus.  PMH: Past Medical History:  Diagnosis Date  . Breast cancer [facility]) 2011   right breast with lumpectomy and rad tx in [place]  . 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 Tamoxifen x 3 years, then exemestane. [facility], [place], [place].   . Melanoma ([place])   . Nephrolithiasis     Surgical History: Past Surgical History:  Procedure Laterality Date  . ABDOMINAL HYSTERECTOMY  1994   partial - still has cervix, ovaries  . BLADDER SUSPENSION     approximately 25 yrs ago  . BREAST BIOPSY Left 2001   benign  . BREAST EXCISIONAL BIOPSY Right 2011   + with  rad tx   . BREAST LUMPECTOMY  2011  . BREAST SURGERY  2001   BIOPSY  . COLONOSCOPY WITH PROPOFOL N/A 07/21/2015   Procedure: COLONOSCOPY WITH PROPOFOL;  Surgeon: Christene Lye, MD;  Location: [facility] ENDOSCOPY;  Service: Endoscopy;  Laterality: N/A;  . CYSTOCELE REPAIR N/A 01/22/2018   Procedure: ANTERIOR REPAIR (CYSTOCELE);  Surgeon: Bjorn Loser, MD;  Location: [facility];  Service: Urology;  Laterality: N/A;  . CYSTOSCOPY N/A 01/22/2018   Procedure: CYSTOSCOPY;  Surgeon: Bjorn Loser, MD;  Location: [facility];  Service: Urology;  Laterality: N/A;  . NM PET DX MELANOMA    . TONSILLECTOMY        Home Medications:  Allergies as of 04/22/2018      Reactions   Latex Rash   Contact dermatitis      Medication List       Accurate as of April 22, 2018  1:50 PM. Always use your most recent med list.        clobetasol ointment 0.05 % Commonly known as:  TEMOVATE APPLY TO LEFT LEG TWICE A DAY FOR THREE WEEKS   dicyclomine 10 MG capsule Commonly known as:  BENTYL Take 1 capsule (10 mg total) by mouth 4 (four) times daily -  before meals and at bedtime. As needed for abdominal cramping   meloxicam 15 MG tablet Commonly known as:  MOBIC TAKE 1 TABLET (15 MG TOTAL) BY MOUTH DAILY AS NEEDED FOR PAIN.   OVER THE COUNTER MEDICATION Take 1 tablet by mouth at bedtime as needed (insomnia). [employer] Sleep Aid       Allergies:  Allergies  Allergen Reactions  . Latex Rash    Contact dermatitis    Family History: Family History  Problem Relation Age of Onset  . Cancer Father        BLADDER /SKIN   . Heart disease Father        Stent placement & CABG x 3  . Heart attack Father 82  . Breast cancer Maternal Grandmother        70's  . Breast cancer Paternal Grandmother        60's    Social History:  reports that she has never smoked. She has never used smokeless tobacco. She reports current alcohol use. She reports that she does not use drugs.  ROS: UROLOGY Frequent Urination?: No Hard to postpone urination?: No Burning/pain with urination?: No Get up at night to urinate?: No Leakage of urine?: No Urine stream starts and stops?: No Trouble starting stream?: No Do you have to strain to urinate?: No Blood in urine?: No Urinary tract infection?: No Sexually transmitted disease?: No Injury to kidneys or bladder?: No Painful intercourse?: No Weak stream?: No Currently pregnant?: No Vaginal bleeding?: No Last menstrual period?: n  Gastrointestinal Nausea?: No Vomiting?: No Indigestion/heartburn?: No Diarrhea?: No Constipation?:  No  Constitutional Fever: No Night sweats?: No Weight loss?: No Fatigue?: No  Skin Skin rash/lesions?: No Itching?: No  Eyes Blurred vision?: No Double vision?: No  Ears/Nose/Throat Sore throat?: No Sinus problems?: No  Hematologic/Lymphatic Swollen glands?: No Easy bruising?: No  Cardiovascular Leg swelling?: No Chest pain?: No  Respiratory Cough?: No Shortness of breath?: No  Endocrine Excessive thirst?: No  Musculoskeletal Back pain?: No Joint pain?: No  Neurological Headaches?: No Dizziness?: No  Psychologic Depression?: No Anxiety?: No  Physical Exam: BP (!) 161/77 (BP Location: Left Arm, Patient Position: Sitting, Cuff Size: Normal)   Pulse 80   Ht 5'  8" (1.727 m)   Wt 169 lb (76.7 kg)   BMI 25.70 kg/m   Constitutional:  Alert and oriented, No acute distress.  Laboratory Data: Lab Results  Component Value Date   WBC 4.4 01/17/2018   HGB 10.0 (L) 01/23/2018   HCT 31.0 (L) 01/23/2018   MCV 87.2 01/17/2018   PLT 279 01/17/2018    Lab Results  Component Value Date   CREATININE 0.72 01/23/2018    No results found for: PSA  No results found for: TESTOSTERONE  Lab Results  Component Value Date   HGBA1C 5.5 12/05/2017    Urinalysis    Component Value Date/Time   APPEARANCEUR Cloudy (A) 12/10/2017 1256   GLUCOSEU Negative 12/10/2017 1256   BILIRUBINUR Neg 03/07/2018 1112   BILIRUBINUR Negative 12/10/2017 1256   PROTEINUR Negative 03/07/2018 1112   PROTEINUR Negative 12/10/2017 1256   UROBILINOGEN 1.0 03/07/2018 1112   NITRITE Neg 03/07/2018 1112   NITRITE Negative 12/10/2017 1256   LEUKOCYTESUR Negative 03/07/2018 1112   LEUKOCYTESUR 2+ (A) 12/10/2017 1256    Pertinent Imaging:   Assessment & Plan: In addition to the physical therapy recommendation I also discussed the role of an anti-inflammatory in the short-term.  Intraoperative findings once again described to the patient reminded about the rare risk of dyspareunia or  pain in the vagina with or without course.  The patient does not have pain otherwise in the vagina only with intercourse  We decided with the following plan.  She will use lots of lubricant.  She will use manual massage technique prior to intercourse and I think this actually may help based upon today's examination as I continued to examine her she was less tender.  She would like to try Mobic and I gave her 15 mg a day with 30 tablets and 1 refill.  Because her deductibles that are very high are starting over again she wanted to hold off on physical therapy consultation at this stage.  Reevaluate in 2 months    There are no diagnoses linked to this encounter.  No follow-ups on file.  Reece Packer, MD  [facility] [address] [place], [place] [address] [phone]

## 2018-04-25 ENCOUNTER — Other Ambulatory Visit: Payer: Self-pay

## 2018-04-25 DIAGNOSIS — Z1231 Encounter for screening mammogram for malignant neoplasm of breast: Secondary | ICD-10-CM

## 2018-06-05 ENCOUNTER — Ambulatory Visit
Admission: RE | Admit: 2018-06-05 | Discharge: 2018-06-05 | Disposition: A | Discharge status: Home / Self Care | Payer: 59 | Source: Ambulatory Visit | Attending: Surgery | Admitting: Surgery

## 2018-06-05 DIAGNOSIS — Z1231 Encounter for screening mammogram for malignant neoplasm of breast: Secondary | ICD-10-CM | POA: Diagnosis present

## 2018-06-05 HISTORY — DX: Personal history of irradiation: Z92.3

## 2018-06-11 ENCOUNTER — Ambulatory Visit: Payer: 59 | Admitting: General Surgery

## 2018-06-18 ENCOUNTER — Other Ambulatory Visit: Payer: Self-pay

## 2018-06-18 ENCOUNTER — Ambulatory Visit (INDEPENDENT_AMBULATORY_CARE_PROVIDER_SITE_OTHER): Payer: 59 | Admitting: General Surgery

## 2018-06-18 ENCOUNTER — Encounter: Payer: Self-pay | Admitting: General Surgery

## 2018-06-18 VITALS — BP 152/84 | HR 75 | Temp 97.9°F | Resp 14 | Ht 68.0 in | Wt 170.4 lb

## 2018-06-18 DIAGNOSIS — Z86000 Personal history of in-situ neoplasm of breast: Secondary | ICD-10-CM | POA: Diagnosis not present

## 2018-06-18 NOTE — Patient Instructions (Signed)
We will see you back in one year for mammogram and breast exam.

## 2018-06-18 NOTE — Progress Notes (Signed)
Patient ID: Alyssa Bradford, female   DOB: 02-07-1961, 58 y.o.   MRN: [id number]  Chief Complaint  Patient presents with  . Follow-up    1 year bil screening mammo    HPI Alyssa Bradford is a 58 y.o. female who presents for a breast evaluation. The most recent mammogram was done on 06/05/18 .  Patient does perform regular self breast checks and gets regular mammograms done.    HPI  Past Medical History:  Diagnosis Date  . Breast cancer [facility]) 2011   right breast with lumpectomy and rad tx in [place]  . 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 Tamoxifen x 3 years, then exemestane. [facility], [place], [place].   . Melanoma ([place])   . Nephrolithiasis   . Personal history of radiation therapy     Past Surgical History:  Procedure Laterality Date  . ABDOMINAL HYSTERECTOMY  1994   partial - still has cervix, ovaries  . BLADDER SUSPENSION     approximately 25 yrs ago  . BREAST BIOPSY Left 2001   benign  . BREAST EXCISIONAL BIOPSY Right 2011   + with rad tx   . BREAST LUMPECTOMY  2011  . BREAST SURGERY  2001   BIOPSY  . COLONOSCOPY WITH PROPOFOL N/A 07/21/2015   Procedure: COLONOSCOPY WITH PROPOFOL;  Surgeon: Christene Lye, MD;  Location: [facility] ENDOSCOPY;  Service: Endoscopy;  Laterality: N/A;  . CYSTOCELE REPAIR N/A 01/22/2018   Procedure: ANTERIOR REPAIR (CYSTOCELE);  Surgeon: Bjorn Loser, MD;  [facility];  Service: Urology;  Laterality: N/A;  . CYSTOSCOPY N/A 01/22/2018   Procedure: CYSTOSCOPY;  Surgeon: Bjorn Loser, MD;  [facility];  Service: Urology;  Laterality: N/A;  . NM PET DX MELANOMA    . TONSILLECTOMY      Family History  Problem Relation Age of Onset  . Cancer Father        BLADDER /SKIN   . Heart disease Father        Stent placement & CABG x 3  . Heart attack Father 12  . Breast cancer Maternal Grandmother        70's  . Breast cancer Paternal  Grandmother        5's    Social History Social History   Tobacco Use  . Smoking status: Never Smoker  . Smokeless tobacco: Never Used  Substance Use Topics  . Alcohol use: Yes    Comment: occasional  . Drug use: No    Allergies  Allergen Reactions  . Latex Rash    Contact dermatitis    Current Outpatient Medications  Medication Sig Dispense Refill  . clobetasol ointment (TEMOVATE) 0.05 % APPLY TO LEFT LEG TWICE A DAY FOR THREE WEEKS  1  . meloxicam (MOBIC) 15 MG tablet Take 1 tablet (15 mg total) by mouth daily as needed for pain. 30 tablet 1  . OVER THE COUNTER MEDICATION Take 1 tablet by mouth at bedtime as needed (insomnia). [employer] Sleep Aid    . dicyclomine (BENTYL) 10 MG capsule Take 1 capsule (10 mg total) by mouth 4 (four) times daily -  before meals and at bedtime. As needed for abdominal cramping (Patient not taking: Reported on 04/22/2018) 30 capsule 0   No current facility-administered medications for this visit.     Review of Systems Review of Systems  Constitutional: Negative.   Respiratory: Negative.   Cardiovascular: Negative.  Blood pressure (!) 152/84, pulse 75, temperature 97.9 F (36.6 C), temperature source Skin, resp. rate 14, height 5\' 8"  (1.727 m), weight 170 lb 6.4 oz (77.3 kg), SpO2 98 %.  Physical Exam Physical Exam Exam conducted with a chaperone present.  Constitutional:      Appearance: She is well-developed.  Eyes:     General: No scleral icterus.    Conjunctiva/sclera: Conjunctivae normal.  Neck:     Musculoskeletal: Normal range of motion.  Cardiovascular:     Rate and Rhythm: Normal rate and regular rhythm.     Heart sounds: Normal heart sounds.  Pulmonary:     Effort: Pulmonary effort is normal.     Breath sounds: Normal breath sounds.  Chest:     Breasts:        Right: No inverted nipple, mass, nipple discharge, skin change or tenderness.        Left: No inverted nipple, mass, nipple discharge, skin change or  tenderness.    Lymphadenopathy:     Cervical: No cervical adenopathy.  Skin:    General: Skin is warm and dry.  Neurological:     Mental Status: She is alert and oriented to person, place, and time.     Data Reviewed Bilateral screening mammograms dated June 05, 2018 were reviewed.  Postsurgical changes.  BI-RADS-1.  Assessment No evidence of recurrent cancer.  Plan  The patient was offered the opportunity to have her annual exams and mammograms completed with her PCP.  At this time she is going to plan to return at least once more.  We will see you back in one year for mammogram and breast exam.   HPI, Physical Exam, Assessment and Plan have been scribed under the direction and in the presence of Robert Bellow, MD. Jonnie Finner, CMA  HPI, Physical Exam, Assessment and Plan have been scribed under the direction and in the presence of Hervey Ard, MD.  Gaspar Cola, CMA  I have completed the exam and reviewed the above documentation for accuracy and completeness.  I agree with the above.  [employer] has been used and any errors in dictation or transcription are unintentional.  Hervey Ard, M.D., F.A.C.S.\  Alyssa Bradford 06/19/2018, 11:14 AM

## 2018-07-01 ENCOUNTER — Ambulatory Visit: Payer: 59 | Admitting: Urology

## 2018-07-08 ENCOUNTER — Encounter: Payer: Self-pay | Admitting: Nurse Practitioner

## 2018-07-08 ENCOUNTER — Other Ambulatory Visit: Payer: Self-pay

## 2018-07-08 ENCOUNTER — Ambulatory Visit (INDEPENDENT_AMBULATORY_CARE_PROVIDER_SITE_OTHER): Payer: 59 | Admitting: Nurse Practitioner

## 2018-07-08 VITALS — BP 132/80 | HR 67 | Temp 98.0°F | Resp 15 | Ht 68.0 in | Wt 172.0 lb

## 2018-07-08 DIAGNOSIS — J019 Acute sinusitis, unspecified: Secondary | ICD-10-CM | POA: Diagnosis not present

## 2018-07-08 DIAGNOSIS — Z20828 Contact with and (suspected) exposure to other viral communicable diseases: Secondary | ICD-10-CM

## 2018-07-08 LAB — POCT INFLUENZA A/B
Influenza A, POC: NEGATIVE
Influenza B, POC: NEGATIVE

## 2018-07-08 NOTE — Progress Notes (Signed)
Subjective:    Patient ID: Alyssa Bradford, female    DOB: 22-Apr-1961, 58 y.o.   MRN: [id number]  Alyssa Bradford is a 58 y.o. female presenting on 07/08/2018 for Headache (started Saturday night.  No Fever reported ) and Sinus Problem   HPI URI symptoms Patient with onset of URI symptoms, arthralgias/myalgias 2 days ago.  Patient has had no fever, chills, or sweats.   - Headache.  Feels some chest pressure/aching.  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  Her whole family is sick with flu.  Advil sinus/allergy for headache control.  Had lots of intense frontal sinus pressure headache.  Took all day yesterday, so today has felt mildly improved.  Then changed to Tylenol.     Social History   Tobacco Use  . Smoking status: Never Smoker  . Smokeless tobacco: Never Used  Substance Use Topics  . Alcohol use: Yes    Comment: occasional  . Drug use: No    Review of Systems Per HPI unless specifically indicated above     Objective:    BP 132/80 (BP Location: Left Arm, Patient Position: Sitting, Cuff Size: Normal)   Pulse 67   Temp 98 F (36.7 C) (Oral)   Resp 15   Ht 5\' 8"  (1.727 m)   Wt 172 lb (78 kg)   SpO2 98%   BMI 26.15 kg/m   Wt Readings from Last 3 Encounters:  07/08/18 172 lb (78 kg)  06/18/18 170 lb 6.4 oz (77.3 kg)  04/22/18 169 lb (76.7 kg)    Physical Exam Vitals signs reviewed.  Constitutional:      Appearance: She is well-developed.  HENT:     Head: Normocephalic and atraumatic.     Right Ear: Hearing, tympanic membrane, ear canal and external ear normal.     Left Ear: Hearing, tympanic membrane, ear canal and external ear normal.     Nose: Mucosal edema present. No rhinorrhea.     Right Sinus: No maxillary sinus tenderness or frontal sinus tenderness.     Left Sinus: No maxillary sinus tenderness or frontal sinus tenderness.     Mouth/Throat:     Lips: Pink.     Mouth: Mucous membranes are  moist.     Pharynx: Uvula midline. Posterior oropharyngeal erythema (mildly injected) present. No pharyngeal swelling, oropharyngeal exudate or uvula swelling.     Tonsils: No tonsillar exudate. Swelling: 0 on the right. 0 on the left.  Eyes:     General: Lids are normal.        Right eye: No discharge.        Left eye: No discharge.     Conjunctiva/sclera: Conjunctivae normal.     Pupils: Pupils are equal, round, and reactive to light.  Neck:     Musculoskeletal: Full passive range of motion without pain, normal range of motion and neck supple.  Cardiovascular:     Rate and Rhythm: Normal rate and regular rhythm.     Pulses: Normal pulses.     Heart sounds: Normal heart sounds, S1 normal and S2 normal.  Pulmonary:     Effort: Pulmonary effort is normal. No respiratory distress.     Breath sounds: Normal breath sounds. No stridor. No wheezing, rhonchi or rales.  Musculoskeletal:     Right lower leg: No edema.     Left lower leg: No edema.  Lymphadenopathy:     Cervical: No  cervical adenopathy.  Skin:    General: Skin is warm and dry.     Capillary Refill: Capillary refill takes less than 2 seconds.  Neurological:     General: No focal deficit present.     Mental Status: She is alert.     GCS: GCS eye subscore is 4. GCS verbal subscore is 5. GCS motor subscore is 6.  Psychiatric:        Attention and Perception: Attention normal.        Mood and Affect: Mood normal.        Behavior: Behavior normal. Behavior is cooperative.        Thought Content: Thought content normal.        Judgment: Judgment normal.    Results for orders placed or performed in visit on 07/08/18  POCT Influenza A/B  Result Value Ref Range   Influenza A, POC Negative Negative   Influenza B, POC Negative Negative      Assessment & Plan:   Problem List Items Addressed This Visit    None    Visit Diagnoses    Acute rhinosinusitis    -  Primary     Acute illness. Fever responsive to NSAIDs and  tylenol.  Symptoms not worsening. Consistent with viral illness x 2 days with known sick contacts with positive flu.  No identifiable focal infections of ears, nose, throat. - Flu test today is negative  Plan: 1. Reassurance, likely self-limited with cough lasting up to few weeks - Start anti-histamine Loratadine or Cetirizine 10mg  daily,  - also can use Flonase 2 sprays each nostril daily for up to 4-6 weeks - Start Mucinex-DM OTC up to 7-10 days then stop 2. Supportive care with nasal saline, warm herbal tea with honey, 3. Improve hydration 4. Tylenol / Motrin PRN fevers 5. Return criteria given   Follow up plan: Return 5-7 days if symptoms worsen or fail to improve.  Cassell Smiles, DNP, AGPCNP-BC Adult Gerontology Primary Care Nurse Practitioner [facility] 07/08/2018, 2:08 PM

## 2018-07-08 NOTE — Patient Instructions (Addendum)
[place],   Thank you for coming in to clinic today.  1. It sounds like you have a Upper Respiratory Virus - this will most likely run it's course in 7 to 10 days. Recommend good hand washing. - Start anti-histamine loratadine or cetirizine 10mg  daily if needed for reducing mucous production. - also can use Flonase 2 sprays each nostril daily for up to 4-6 weeks - If congestion is worse, start OTC Mucinex (or may try Mucinex-DM for cough) up to 7-10 days then stop - Drink plenty of fluids to improve congestion - You may try over the counter Nasal Saline spray (Simply Saline, Ocean Spray) as needed to reduce congestion. - Drink warm herbal tea with honey for sore throat. - Start taking Tylenol extra strength 1 to 2 tablets every 6-8 hours for aches or fever/chills for next few days as needed.  Do not take more than 3,000 mg in 24 hours from all medicines.  May take Ibuprofen as well if tolerated 200-400mg  every 8 hours as needed.  If symptoms significantly worsening with persistent fevers/chills despite tylenol/ibpurofen, nausea, vomiting unable to tolerate food/fluids or medicine, body aches, or shortness of breath, sinus pain pressure or worsening productive cough, then follow-up for re-evaluation, may seek more immediate care at Urgent Care or ED if more concerned for emergency.  Please schedule a follow-up appointment with Cassell Smiles, AGNP. Return 5-7 days if symptoms worsen or fail to improve.  If you have any other questions or concerns, please feel free to call the clinic or send a message through [facility]. You may also schedule an earlier appointment if necessary.  You will receive a survey after today's visit either digitally by e-mail or paper by [employer]. Your experiences and feedback matter to [place].  Please respond so we know how we are doing as we provide care for you.   Cassell Smiles, DNP, AGNP-BC Adult Gerontology Nurse Practitioner [facility]

## 2018-11-19 ENCOUNTER — Encounter: Payer: Self-pay | Admitting: General Surgery

## 2018-12-04 ENCOUNTER — Other Ambulatory Visit: Payer: 59

## 2018-12-11 ENCOUNTER — Encounter: Payer: Self-pay | Admitting: Family Medicine

## 2018-12-11 ENCOUNTER — Encounter: Payer: 59 | Admitting: Family Medicine

## 2018-12-11 ENCOUNTER — Other Ambulatory Visit: Payer: Self-pay

## 2018-12-11 ENCOUNTER — Ambulatory Visit (INDEPENDENT_AMBULATORY_CARE_PROVIDER_SITE_OTHER): Payer: 59 | Admitting: Family Medicine

## 2018-12-11 VITALS — BP 131/70 | HR 72 | Temp 97.7°F | Resp 16 | Ht 68.0 in | Wt 173.0 lb

## 2018-12-11 DIAGNOSIS — Z0000 Encounter for general adult medical examination without abnormal findings: Secondary | ICD-10-CM | POA: Diagnosis not present

## 2018-12-11 NOTE — Patient Instructions (Addendum)
Thank you for coming to the office today.  Yearly mammogram, consider return to general surgeon if need.  Ask insurance about coverage of "Yearly Preventative Blood work - perhaps it has to do with an diagnosis code - ask if ONLY code Z00.0 is used if that will be covered or if this goes to deductible.  ---------------------------------------------------  You have thick impacted ear wax (cerumen) blocking ear canals and ear drums. This is the most likely cause of reduced hearing and ear pain and discomfort.  - After ear flushing in the office, it does appear to have loosened up and is much softer, only partial removal though as the larger pieces are still deeper in.  Recommend using theover the counter Debrox (Carbamide peroxide), use on whichever side you need one or both following instructions on bottle, can use for 1-2 weeks prior to coming in for ear flushing  Avoid using Q-tips inside ears, as this can push wax deeper, but you can try to use rolled up kleenex as a wick to absorb fluid and wax as well.  If you are not making progress with ear wax removal at home, or the problem keeps coming back, please notify our office or return for re-evaluation, and we can discuss referral to ENT office for more formal ear wax removal. ----------------------------------- You most likely have "Tennis Elbow" or "Lateral Epicondylitis" - This is inflammation or tendonitis affecting the muscles of your forearm - Usually it is caused by frequent or daily repetitive activities using your arms, lifting, rotating, pulling, twisting - it can happen over days to months or longer from repetitive strain  One of the most important parts of treatment is rest and avoiding the activities that make it worse.  Recommend may do trial of Anti-inflammatory with Naproxen (Aleve) - take one to two with food and plenty of water TWICE daily every day (breakfast and dinner), for next 2 to 4 weeks, then you may take only as  needed - DO NOT TAKE any ibuprofen, motrin while you are taking this medicine - It is safe to take Tylenol Ext Str 500mg  tabs - take 1 to 2 (max dose 1000mg ) every 6 hours as needed for breakthrough pain, max 24 hour daily dose is 6 to 8 tablets or 4000mg   Use RICE therapy: - R - Rest / relative rest with activity modification avoid overuse of joint - I - Ice packs (make sure you use a towel or sock / something to protect skin) - C - Compression with ACE wrap to apply pressure and reduce swelling allowing more support  Try a Forearm Tennis Elbow Strap over muscle as demonstrated - use with repetitive activity to reduce strain of muscle    - E - Elevation - if significant swelling, lift leg above heart level (toes above your nose) to help reduce swelling, most helpful at night after day of being on your feet  May consider Physical Therapy referral if interested  ---------------------------  DUE for FASTING BLOOD WORK (no food or drink after midnight before the lab appointment, only water or coffee without cream/sugar on the morning of)  SCHEDULE "Lab Only" visit in the morning at the clinic for lab draw in 1 YEAR  - Make sure Lab Only appointment is at about 1 week before your next appointment, so that results will be available  For Lab Results, once available within 2-3 days of blood draw, you can can log in to MyChart online to view your results and a brief  explanation. Also, we can discuss results at next follow-up visit.    Please schedule a Follow-up Appointment to: Return in about 1 year (around 12/11/2019) for Annual Physical.  If you have any other questions or concerns, please feel free to call the office or send a message through [place]. You may also schedule an earlier appointment if necessary.  Additionally, you may be receiving a survey about your experience at our office within a few days to 1 week by e-mail or mail. We value your feedback.  Nobie Putnam,  DO [place]

## 2018-12-11 NOTE — Progress Notes (Signed)
Subjective:    Patient ID: Alyssa Bradford, female    DOB: 06/22/1960, 58 y.o.   MRN: [id number]  Alyssa Bradford is a 58 y.o. female presenting on 12/11/2018 for Annual Exam   HPI   Here for Annual Physical. She did not have blood work done due to high deductible plan.  Lifestyle - Diet: following Keto diet, still maintaining weight some slight increase weight gain due to vacation but improved, drinking mostly water - Exercise: More activity busy working at store, but less exercise walking  Follow-up Left Hip Pain OA/DJD - Reports no significant change from previous, still with episodic L-hip pain and stiffness, some attributed to inactivity at times worsening, still some pain, took meloxicam in past, ran out of rx not restarted. Now takes Tylenol time release PM mostly with some relief - She did not pursue previous [facility] as ordered  History of Melanoma, s/p excision History of Melanoma excision in 2007 from Left lower leg, by Dermatology in [place]. She continues with them for yearly follow-up. No new concerns.  Follow-up Bladder Prolpase / History of prior surgery w/ Bladder Suspension, Hysterectomy and reconstruction. - Scheduled to see [facility] in 1 week to discuss options for this  Right Ear Cerumen Cerumen impacted, few years ago saw ENT. She has long history of multiple ear infections (27) in R ear in past, and she had ear wax removed in past. She said last year or so she had ENT visit for cerumen impaction cleaning and said that it caused her discomfort.   Health Maintenance:  Due for Flu Shot, will get at [facility] and provide document  Cervical Cancer Screening: She declines further cervical cancer screening, says that previous hysterectomy says that they removed cervix now and was told no longer needs pap smear except maybe every 10 years if she wanted. Last pap smear approx 5 years ago 2014, negative by report. She has not had abnormal pap.  Breast CA  Screening: UTD on mammogram - she may no longer follow up with Gen Surgery now after 10 yr from cancer she can do mammogram yearly by their report - Last mammogram result negative bi-rads 1 (06/05/18) done at [facility]. Prior abnormal results with dx Breast Cancer in 2010 s/p tamoxifen and radiation - and also years ago had R breast benign abnormality excision biopsy, no recent abnormals in past several years.  Known family history of breast cancer both grandmothers in family with breast cancer age 93-70s. Currently asymptomatic.  Colon CA Screening: Last Colonoscopy 07/21/15 (done by Dr Jamal Collin [facility]), results with no polyps or abnormality, good for 10 years, 2027. Currently asymptomatic. No known family history of colon CA. UTD currently.    Depression screen [facility] 2/9 12/11/2018 12/05/2017 11/09/2017  Decreased Interest 0 0 0  Down, Depressed, Hopeless 0 0 0  PHQ - 2 Score 0 0 0    Past Medical History:  Diagnosis Date  . Breast cancer [facility]) 2011   right breast with lumpectomy and rad tx in [place]  . 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 Tamoxifen x 3 years, [facility] [place]   . [place]   . Nephrolithiasis   . Personal history of radiation therapy    Past Surgical History:  Procedure Laterality Date  . ABDOMINAL HYSTERECTOMY  1994   partial - still has cervix, ovaries  . BLADDER SUSPENSION  approximately 25 yrs ago  . BREAST BIOPSY Left 2001   benign  . BREAST EXCISIONAL BIOPSY Right 2011   + with rad tx   . BREAST LUMPECTOMY  2011  . BREAST SURGERY  2001   BIOPSY  . COLONOSCOPY WITH PROPOFOL N/A 07/21/2015   Procedure: COLONOSCOPY WITH PROPOFOL;  Surgeon: Christene Lye, MD;  Location: [facility] ENDOSCOPY;  Service: Endoscopy;  Laterality: N/A;  . CYSTOCELE REPAIR N/A 01/22/2018   Procedure: ANTERIOR REPAIR (CYSTOCELE);  Surgeon:  Bjorn Loser, MD;  Location: [facility] ORS;  Service: Urology;  Laterality: N/A;  . CYSTOSCOPY N/A 01/22/2018   Procedure: CYSTOSCOPY;  Surgeon: Bjorn Loser, MD;  Location: [facility] ORS;  Service: Urology;  Laterality: N/A;  . NM PET DX MELANOMA    . TONSILLECTOMY     Social History   Socioeconomic History  . Marital status: Married    Spouse name: Not on file  . Number of children: Not on file  . Years of education: Not on file  . Highest education level: Not on file  Occupational History  . Not on file  Social Needs  . Financial resource strain: Not on file  . Food insecurity    Worry: Not on file    Inability: Not on file  . Transportation needs    Medical: Not on file    Non-medical: Not on file  Tobacco Use  . Smoking status: Never Smoker  . Smokeless tobacco: Never Used  Substance and Sexual Activity  . Alcohol use: Yes    Comment: occasional  . Drug use: No  . Sexual activity: Not on file  Lifestyle  . Physical activity    Days per week: Not on file    Minutes per session: Not on file  . Stress: Not on file  Relationships  . Social Herbalist on phone: Not on file    Gets together: Not on file    Attends religious service: Not on file    Active member of club or organization: Not on file    Attends meetings of clubs or organizations: Not on file    Relationship status: Not on file  . Intimate partner violence    Fear of current or ex partner: Not on file    Emotionally abused: Not on file    Physically abused: Not on file    Forced sexual activity: Not on file  Other Topics Concern  . Not on file  Social History Narrative  . Not on file   Family History  Problem Relation Age of Onset  . Cancer Father        BLADDER /SKIN   . Heart disease Father        Stent placement & CABG x 3  . Heart attack Father 38  . Breast cancer Maternal Grandmother        70's  . Breast cancer Paternal Grandmother        4's   Current Outpatient Medications  on File Prior to Visit  Medication Sig  . clobetasol ointment (TEMOVATE) 0.05 % as needed. Once every three month  . OVER THE COUNTER MEDICATION Take 1 tablet by mouth at bedtime as needed (insomnia). [employer] Sleep Aid   No current facility-administered medications on file prior to visit.     Review of Systems  Constitutional: Negative for activity change, appetite change, chills, diaphoresis, fatigue and fever.  HENT: Negative for congestion and hearing loss.  Ear wax  Eyes: Negative for visual disturbance.  Respiratory: Negative for cough, chest tightness, shortness of breath and wheezing.   Cardiovascular: Negative for chest pain, palpitations and leg swelling.  Gastrointestinal: Negative for abdominal pain, anal bleeding, blood in stool, constipation, diarrhea, nausea and vomiting.  Genitourinary: Negative for dysuria, frequency and hematuria.  Musculoskeletal: Positive for arthralgias (Hips, left elbow). Negative for neck pain.  Skin: Negative for rash.  Allergic/Immunologic: Negative for environmental allergies.  Neurological: Negative for dizziness, weakness, light-headedness, numbness and headaches.  Hematological: Negative for adenopathy.  Psychiatric/Behavioral: Negative for behavioral problems, dysphoric mood and sleep disturbance.   Per HPI unless specifically indicated above      Objective:    BP 131/70   Pulse 72   Temp 97.7 F (36.5 C) (Oral)   Resp 16   Ht '5\' 8"'  (1.727 m)   Wt 173 lb (78.5 kg)   BMI 26.30 kg/m   Wt Readings from Last 3 Encounters:  12/11/18 173 lb (78.5 kg)  07/08/18 172 lb (78 kg)  06/18/18 170 lb 6.4 oz (77.3 kg)    Physical Exam Vitals signs and nursing note reviewed.  Constitutional:      General: She is not in acute distress.    Appearance: She is well-developed. She is not diaphoretic.     Comments: Well-appearing, comfortable, cooperative  HENT:     Head: Normocephalic and atraumatic.     Right Ear: Ear canal and  external ear normal.     Left Ear: Tympanic membrane, ear canal and external ear normal.     Ears:     Comments: R ear large soft cerumen impacted. Eyes:     General:        Right eye: No discharge.        Left eye: No discharge.     Conjunctiva/sclera: Conjunctivae normal.     Pupils: Pupils are equal, round, and reactive to light.  Neck:     Musculoskeletal: Normal range of motion and neck supple.     Thyroid: No thyromegaly.  Cardiovascular:     Rate and Rhythm: Normal rate and regular rhythm.     Heart sounds: Normal heart sounds. No murmur.  Pulmonary:     Effort: Pulmonary effort is normal. No respiratory distress.     Breath sounds: Normal breath sounds. No wheezing or rales.  Abdominal:     General: Bowel sounds are normal. There is no distension.     Palpations: Abdomen is soft. There is no mass.     Tenderness: There is no abdominal tenderness.  Musculoskeletal: Normal range of motion.        General: No tenderness.     Comments: Upper / Lower Extremities: - Normal muscle tone, strength bilateral upper extremities 5/5, lower extremities 5/5  Some mild stiffness in reduced ROM with change of position but ambulate normally.  Left elbow both lateral and medial epicondyl mild discomfort to palpation  Lymphadenopathy:     Cervical: No cervical adenopathy.  Skin:    General: Skin is warm and dry.     Findings: No erythema or rash.  Neurological:     Mental Status: She is alert and oriented to person, place, and time.     Comments: Distal sensation intact to light touch all extremities  Psychiatric:        Behavior: Behavior normal.     Comments: Well groomed, good eye contact, normal speech and thoughts       I have personally  reviewed the radiology report from 06/05/18 Mammogram.  CLINICAL DATA:  Screening.  EXAM: DIGITAL SCREENING BILATERAL MAMMOGRAM WITH TOMO AND CAD  COMPARISON:  Previous exam(s).  ACR Breast Density Category c: The breast tissue is  heterogeneously dense, which may obscure small masses.  FINDINGS: There are no findings suspicious for malignancy. Images were processed with CAD.  IMPRESSION: No mammographic evidence of malignancy. A result letter of this screening mammogram will be mailed directly to the patient.  RECOMMENDATION: Screening mammogram in one year. (Code:SM-B-01Y)  BI-RADS CATEGORY  1: Negative.   Electronically Signed   By: Fidela Salisbury M.D.   On: 06/05/2018 12:54      Assessment & Plan:   Problem List Items Addressed This Visit    None    Visit Diagnoses    Annual physical exam    -  Primary      Updated Health Maintenance information -Colonoscopy 2027 - Yearly mammogram 05/2019 next, may not return to gen surgery if need - she declined pap smear cervical screening d/t prior hysterectomy advice given that she doesn't need future pap, may consider GYN if need pelvic in future Reviewed recent lab results with patient Encouraged improvement to lifestyle with diet and exercise - Goal of weight loss  #Cerumen, R ear Chronic problem, no complication but impacted soft cerumen today Trial home debrox kit removal first F/u in future for lavage removal if need  #OA/DJD hips and L elbow possibly tendonitis Advised conservative care OTC remedy and forearm strap to try if need Follow-up as needed  No orders of the defined types were placed in this encounter.   Follow up plan: Return in about 1 year (around 12/11/2019) for Annual Physical.   Nobie Putnam, DO [facility] 12/11/2018, 1:55 PM

## 2019-03-25 ENCOUNTER — Encounter: Payer: Self-pay | Admitting: Family Medicine

## 2019-03-25 ENCOUNTER — Other Ambulatory Visit: Payer: Self-pay

## 2019-03-25 ENCOUNTER — Ambulatory Visit (INDEPENDENT_AMBULATORY_CARE_PROVIDER_SITE_OTHER): Payer: 59 | Admitting: Family Medicine

## 2019-03-25 DIAGNOSIS — K625 Hemorrhage of anus and rectum: Secondary | ICD-10-CM | POA: Diagnosis not present

## 2019-03-25 DIAGNOSIS — R195 Other fecal abnormalities: Secondary | ICD-10-CM

## 2019-03-25 DIAGNOSIS — R198 Other specified symptoms and signs involving the digestive system and abdomen: Secondary | ICD-10-CM | POA: Diagnosis not present

## 2019-03-25 DIAGNOSIS — K649 Unspecified hemorrhoids: Secondary | ICD-10-CM

## 2019-03-25 NOTE — Progress Notes (Signed)
Virtual Visit via Telephone The purpose of this virtual visit is to provide medical care while limiting exposure to the novel coronavirus (COVID19) for both patient and office staff.  Consent was obtained for phone visit:  Yes.   Answered questions that patient had about telehealth interaction:  Yes.   I discussed the limitations, risks, security and privacy concerns of performing an evaluation and management service by telephone. I also discussed with the patient that there may be a patient responsible charge related to this service. The patient expressed understanding and agreed to proceed.  Patient Location: Home Provider Location: [facility])  ---------------------------------------------------------------------- Chief Complaint  Patient presents with  . Rectal Bleeding    intermittent bloody mucus stool. Pt admits that she does have hemorrhoids x 58yr     S: Reviewed CMA documentation. I have called patient and gathered additional HPI as follows:  Rectal Bleeding / Mucus in Stool / irregular bowel habits / Hemorrhoids  Reports that symptoms started >1 year ago with episodes of bloody stool. She has not discussed with me or other provider before. She says when she does go to bathroom often bowel movements have mucus associated, and occasionally there is a bowel movement with predominantly mucus only without actual stool, occasionally with some abdominal cramping and discomfort but usually not significantly painful. Additionally describes rectal bleeding with episodes of fresh bright red blood with bowel movement mixed in. Typically she has some flared hemorrhoids that cause rectal pain and bleeding. - History of bladder suspension around age 14, she was diagnosed with hemorrhoids at that time, she believes external - she can feel and see the hemorrhoid and uses topical medicated wipes. - Now increasing frequency of episodes. - Previously seen by [facility] Dr Allen Norris - she  is asking about referral. Had prior colonoscopy Dr Jamal Collin on 07/21/15 that was negative without polyp and no hemorrhoids based on that report was told it was good for 10 years.  Denies any fevers, chills, sweats, body ache, cough, shortness of breath, sinus pain or pressure, headache, abdominal pain  Past Medical History:  Diagnosis Date  . Breast cancer [facility]) 2011   right breast with lumpectomy and rad tx in [place]  . 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 Tamoxifen x 3 years, then exemestane. [facility], [place], [place].   . Melanoma ([place])   . Nephrolithiasis   . Personal history of radiation therapy    Social History   Tobacco Use  . Smoking status: Never Smoker  . Smokeless tobacco: Never Used  Substance Use Topics  . Alcohol use: Yes    Comment: occasional  . Drug use: No    Current Outpatient Medications:  .  clobetasol ointment (TEMOVATE) 0.05 %, as needed. Once every three month, Disp: , Rfl: 1 .  OVER THE COUNTER MEDICATION, Take 1 tablet by mouth at bedtime as needed (insomnia). [employer] Sleep Aid, Disp: , Rfl:   Depression screen [facility] 2/9 12/11/2018 12/05/2017 11/09/2017  Decreased Interest 0 0 0  Down, Depressed, Hopeless 0 0 0  PHQ - 2 Score 0 0 0    No flowsheet data found.  -------------------------------------------------------------------------- O: No physical exam performed due to remote telephone encounter.  Lab results reviewed.  No results found for this or any previous visit (from the past 2160 hour(s)).  -------------------------------------------------------------------------- A&P:  Problem List Items Addressed This Visit    None  Visit Diagnoses    Rectal bleeding    -  Primary   Relevant Orders   Ambulatory referral to Gastroenterology   Mucus in stool       Relevant Orders   Ambulatory referral to Gastroenterology   Irregular bowel habits        Relevant Orders   Ambulatory referral to Gastroenterology   Hemorrhoids, unspecified hemorrhoid type       Relevant Orders   Ambulatory referral to Gastroenterology     Constellation of functional GI symptoms and hemorrhoids based on past history, question if also has internal hemorrhoids with some painless bleeding. - Prior colonoscopy 2017 is reassuring but, uncertain given no dx hemorrhoids on it  Offered anusol suppository or other hemorrhoid treatment, declined at this time, continue OTC Also may consider future dicyclomine (bentyl) for abdominal Gi upset cramping - defer med options for now, she prefers to consult with GI  Referral to [facility] Dr Allen Norris (previous provider) for irregular bowel habits worsening in frequency over >1 year with mucus in stool, cramping abdominal pain at times, and associated rectal bleeding with known external hemorrhoids, possible internal hemorrhoids. Last colonoscopy 2017 by Dr Jamal Collin unremarkable. Report is in [place]. Now with persistent worsening symptoms and irregular bowel habits would recommend return to GI for further evaluation.   Orders Placed This Encounter  Procedures  . Ambulatory referral to Gastroenterology    Referral Priority:   Routine    Referral Type:   Consultation    Referral Reason:   Specialty Services Required    Number of Visits Requested:   1     No orders of the defined types were placed in this encounter.   Follow-up: - Return as needed if not improved  Patient verbalizes understanding with the above medical recommendations including the limitation of remote medical advice.  Specific follow-up and call-back criteria were given for patient to follow-up or seek medical care more urgently if needed.   - Time spent in direct consultation with patient on phone: 8 minutes   Nobie Putnam, [facility] 03/25/2019, 4:53 PM

## 2019-03-25 NOTE — Patient Instructions (Addendum)
Referral sent to Dr Allen Norris [facility] in [facility] Stay tuned for apt Let us know if we can help with other meds if needed for hemorrhoids or abdominal cramping discomfort.  Please schedule a Follow-up Appointment to: Return if symptoms worsen or fail to improve.  If you have any other questions or concerns, please feel free to call the office or send a message through [place]. You may also schedule an earlier appointment if necessary.  Additionally, you may be receiving a survey about your experience at our office within a few days to 1 week by e-mail or mail. We value your feedback.  Nobie Putnam, DO [place]

## 2019-04-22 ENCOUNTER — Telehealth: Payer: Self-pay

## 2019-04-22 NOTE — Telephone Encounter (Signed)
Spoke with patient about date for upcoming mammogram due in February. She would like to get her mammograms through her PCP. I let her know that was fine and to call us if she has any need in the future for our services.

## 2019-05-13 ENCOUNTER — Encounter: Payer: Self-pay | Admitting: Gastroenterology

## 2019-05-13 ENCOUNTER — Other Ambulatory Visit: Payer: Self-pay

## 2019-05-13 ENCOUNTER — Ambulatory Visit (INDEPENDENT_AMBULATORY_CARE_PROVIDER_SITE_OTHER): Payer: Managed Care, Other (non HMO) | Admitting: Gastroenterology

## 2019-05-13 VITALS — BP 125/72 | HR 79 | Temp 97.9°F | Ht 68.0 in | Wt 179.0 lb

## 2019-05-13 DIAGNOSIS — D500 Iron deficiency anemia secondary to blood loss (chronic): Secondary | ICD-10-CM

## 2019-05-13 DIAGNOSIS — K921 Melena: Secondary | ICD-10-CM | POA: Diagnosis not present

## 2019-05-13 NOTE — Progress Notes (Signed)
Gastroenterology Consultation  Referring Provider:     Nobie Putnam * Primary Care Physician:  Olin Hauser, DO Primary Gastroenterologist:  Dr. Allen Norris     Reason for Consultation:     Change in bowel habits rectal bleeding and mucus in the stool        HPI:   Alyssa Bradford is a 59 y.o. y/o female referred for consultation & management of change in bowel habits rectal bleeding and mucus in the stool by Dr. Parks Ranger, Devonne Doughty, DO.  This patient comes in today after having a colonoscopy by Dr. Jamal Collin in 2017 that was reported to be absolutely normal without any report of hemorrhoids and recommended a repeat colonoscopy in 10 years.  The patient now reports that she has had mucus in her stools abdominal pain and rectal bleeding for over a year.  The patient was seen by her primary care provider back in December and due to the persistent nature of her symptoms she was referred to GI.  The patient had reported that sometimes she does not have a bowel movement but just passes mucus.  The blood is typically bright red in color.  The patient reports that at times she would not even know it but she will pass mucus per rectum.  The patient denies ever seeing a gastroenterologist in the past.  She thinks some of this may be related to her keto diet.  She denies any abdominal pain although she states that she may have had some lower abdominal pain when she saw her primary care provider.  Past Medical History:  Diagnosis Date  . Breast cancer [facility]) 2011   right breast with lumpectomy and rad tx in [place]  . 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 Tamoxifen x 3 years, then exemestane. [facility], [place], [place].   . Melanoma ([place])   . Nephrolithiasis   . Personal history of radiation therapy     Past Surgical History:  Procedure Laterality Date  . ABDOMINAL HYSTERECTOMY  1994   partial - still has cervix, ovaries  . BLADDER SUSPENSION     approximately 25 yrs ago  . BREAST BIOPSY Left 2001   benign  . BREAST EXCISIONAL BIOPSY Right 2011   + with rad tx   . BREAST LUMPECTOMY  2011  . BREAST SURGERY  2001   BIOPSY  . COLONOSCOPY WITH PROPOFOL N/A 07/21/2015   Procedure: COLONOSCOPY WITH PROPOFOL;  Surgeon: Christene Lye, MD;  Location: [facility] ENDOSCOPY;  Service: Endoscopy;  Laterality: N/A;  . CYSTOCELE REPAIR N/A 01/22/2018   Procedure: ANTERIOR REPAIR (CYSTOCELE);  Surgeon: Bjorn Loser, MD;  Location: [facility] ORS;  Service: Urology;  Laterality: N/A;  . CYSTOSCOPY N/A 01/22/2018   Procedure: CYSTOSCOPY;  Surgeon: Bjorn Loser, MD;  Location: [facility] ORS;  Service: Urology;  Laterality: N/A;  . NM PET DX MELANOMA    . TONSILLECTOMY      Prior to Admission medications   Medication Sig Start Date End Date Taking? Authorizing Provider  clobetasol ointment (TEMOVATE) 0.05 % as needed. Once every three month 01/09/18   [provider]  OVER THE COUNTER MEDICATION Take 1 tablet by mouth at bedtime as needed (insomnia). [employer] Sleep Aid    [provider]    Family History  Problem Relation Age of Onset  . Cancer Father        BLADDER /SKIN   . Heart  disease Father        Stent placement & CABG x 3  . Heart attack Father 59  . Breast cancer Maternal Grandmother        70's  . Breast cancer Paternal Grandmother        62's     Social History   Tobacco Use  . Smoking status: Never Smoker  . Smokeless tobacco: Never Used  Substance Use Topics  . Alcohol use: Yes    Comment: occasional  . Drug use: No    Allergies as of 05/13/2019 - Review Complete 03/25/2019  Allergen Reaction Noted  . Latex Rash 02/22/2015    Review of Systems:    All systems reviewed and negative except where noted in HPI.   Physical Exam:  There were no vitals taken for this visit. No LMP recorded. Patient has had a hysterectomy. General:   Alert,   Well-developed, well-nourished, pleasant and cooperative in NAD Head:  Normocephalic and atraumatic. Eyes:  Sclera clear, no icterus.   Conjunctiva pink. Ears:  Normal auditory acuity. Neck:  Supple; no masses or thyromegaly. Lungs:  Respirations even and unlabored.  Clear throughout to auscultation.   No wheezes, crackles, or rhonchi. No acute distress. Heart:  Regular rate and rhythm; no murmurs, clicks, rubs, or gallops. Abdomen:  Normal bowel sounds.  No bruits.  Soft, non-tender and non-distended without masses, hepatosplenomegaly or hernias noted.  No guarding or rebound tenderness.  Negative Carnett sign.   Rectal:  Deferred.  Pulses:  Normal pulses noted. Extremities:  No clubbing or edema.  No cyanosis. Neurologic:  Alert and oriented x3;  grossly normal neurologically. Skin:  Intact without significant lesions or rashes.  No jaundice. Lymph Nodes:  No significant cervical adenopathy. Psych:  Alert and cooperative. Normal mood and affect.  Imaging Studies: No results found.  Assessment and Plan:   Alyssa Bradford is a 59 y.o. y/o female who comes in today with a year or more of passing mucus with rectal bleeding.  The patient also reports that her mother and father both had polyps before the age of 57 and was not aware that she needs to be on a 5-year surveillance program due to family history then the 10 years recommended by the surgeon who did her colonoscopy.  The patient will also be set up for a colonoscopy at this time due to her above symptoms.  The patient has been told to increase fiber in her diet because her symptoms may represent irritable bowel syndrome which she reports her mother also has.  The patient has been explained the plan and agrees with it.    Lucilla Lame, MD. Marval Regal    Note: This dictation was prepared with Dragon dictation along with smaller phrase technology. Any transcriptional errors that result from this process are unintentional.

## 2019-05-14 ENCOUNTER — Other Ambulatory Visit: Payer: Self-pay

## 2019-05-14 DIAGNOSIS — D500 Iron deficiency anemia secondary to blood loss (chronic): Secondary | ICD-10-CM

## 2019-06-19 ENCOUNTER — Encounter: Payer: Self-pay | Admitting: Gastroenterology

## 2019-07-21 ENCOUNTER — Other Ambulatory Visit: Payer: Self-pay | Admitting: Family Medicine

## 2019-07-21 DIAGNOSIS — Z1231 Encounter for screening mammogram for malignant neoplasm of breast: Secondary | ICD-10-CM

## 2019-08-18 ENCOUNTER — Other Ambulatory Visit: Payer: Self-pay

## 2019-08-18 ENCOUNTER — Encounter: Payer: Self-pay | Admitting: Gastroenterology

## 2019-08-19 ENCOUNTER — Other Ambulatory Visit: Payer: Self-pay

## 2019-08-19 DIAGNOSIS — D500 Iron deficiency anemia secondary to blood loss (chronic): Secondary | ICD-10-CM

## 2019-08-20 LAB — CBC WITH DIFFERENTIAL/PLATELET
Basophils Absolute: 0.1 10*3/uL (ref 0.0–0.2)
Basos: 2 %
EOS (ABSOLUTE): 0.1 10*3/uL (ref 0.0–0.4)
Eos: 2 %
Hematocrit: 38.3 % (ref 34.0–46.6)
Hemoglobin: 12.9 g/dL (ref 11.1–15.9)
Immature Grans (Abs): 0 10*3/uL (ref 0.0–0.1)
Immature Granulocytes: 0 %
Lymphocytes Absolute: 1.1 10*3/uL (ref 0.7–3.1)
Lymphs: 23 %
MCH: 31.5 pg (ref 26.6–33.0)
MCHC: 33.7 g/dL (ref 31.5–35.7)
MCV: 94 fL (ref 79–97)
Monocytes Absolute: 0.5 10*3/uL (ref 0.1–0.9)
Monocytes: 10 %
Neutrophils Absolute: 3.2 10*3/uL (ref 1.4–7.0)
Neutrophils: 63 %
Platelets: 217 10*3/uL (ref 150–450)
RBC: 4.09 x10E6/uL (ref 3.77–5.28)
RDW: 12.5 % (ref 11.7–15.4)
WBC: 5 10*3/uL (ref 3.4–10.8)

## 2019-08-21 ENCOUNTER — Other Ambulatory Visit
Admission: RE | Admit: 2019-08-21 | Discharge: 2019-08-21 | Disposition: A | Discharge status: Home / Self Care | Payer: 59 | Source: Ambulatory Visit | Attending: Gastroenterology | Admitting: Gastroenterology

## 2019-08-21 ENCOUNTER — Telehealth: Payer: Self-pay

## 2019-08-21 ENCOUNTER — Other Ambulatory Visit: Payer: Self-pay

## 2019-08-21 DIAGNOSIS — Z01812 Encounter for preprocedural laboratory examination: Secondary | ICD-10-CM | POA: Diagnosis present

## 2019-08-21 DIAGNOSIS — Z20822 Contact with and (suspected) exposure to covid-19: Secondary | ICD-10-CM | POA: Insufficient documentation

## 2019-08-21 LAB — SARS CORONAVIRUS 2 (TAT 6-24 HRS): SARS Coronavirus 2: NEGATIVE

## 2019-08-21 NOTE — Telephone Encounter (Signed)
Pt notified of lab result. Pt would like to know since her CBC has returned to normal, can we cancel the EGD and move her colonoscopy to a screening?

## 2019-08-21 NOTE — Telephone Encounter (Signed)
-----   Message from Lucilla Lame, MD sent at 08/20/2019  8:09 AM EDT ----- Let the patient know that the blood count was normal.

## 2019-08-21 NOTE — Telephone Encounter (Signed)
Let the patient know we can cancel the upper endoscopy since her blood count is back to normal but that does not explain the year of rectal bleeding she has had.  Even if we write this off as hemorrhoids she still has a family history of her parents having polyps before the age of 54 requiring surveillance colonoscopies for family history.  Screening colonoscopies are done every 10 years without any family history or personal history of polyps therefore her last colonoscopy was in 2017.  Therefore we can switch the colonoscopy to family history of polyps but not screening.

## 2019-08-22 ENCOUNTER — Other Ambulatory Visit: Payer: Self-pay

## 2019-08-22 MED ORDER — PEG 3350-KCL-NA BICARB-NACL 420 G PO SOLR
ORAL | 0 refills | Status: DC
Start: 2019-08-22 — End: 2020-03-17

## 2019-08-22 NOTE — Telephone Encounter (Signed)
Pt notified of this information per Dr. Allen Norris.

## 2019-08-22 NOTE — Discharge Instructions (Signed)
General Anesthesia, Adult, Care After This sheet gives you information about how to care for yourself after your procedure. Your health care provider may also give you more specific instructions. If you have problems or questions, contact your health care provider. What can I expect after the procedure? After the procedure, the following side effects are common:  Pain or discomfort at the IV site.  Nausea.  Vomiting.  Sore throat.  Trouble concentrating.  Feeling cold or chills.  Weak or tired.  Sleepiness and fatigue.  Soreness and body aches. These side effects can affect parts of the body that were not involved in surgery. Follow these instructions at home:  For at least 24 hours after the procedure:  Have a responsible adult stay with you. It is important to have someone help care for you until you are awake and alert.  Rest as needed.  Do not: ? Participate in activities in which you could fall or become injured. ? Drive. ? Use heavy machinery. ? Drink alcohol. ? Take sleeping pills or medicines that cause drowsiness. ? Make important decisions or sign legal documents. ? Take care of children on your own. Eating and drinking  Follow any instructions from your health care provider about eating or drinking restrictions.  When you feel hungry, start by eating small amounts of foods that are soft and easy to digest (bland), such as toast. Gradually return to your regular diet.  Drink enough fluid to keep your urine pale yellow.  If you vomit, rehydrate by drinking water, juice, or clear broth. General instructions  If you have sleep apnea, surgery and certain medicines can increase your risk for breathing problems. Follow instructions from your health care provider about wearing your sleep device: ? Anytime you are sleeping, including during daytime naps. ? While taking prescription pain medicines, sleeping medicines, or medicines that make you drowsy.  Return to  your normal activities as told by your health care provider. Ask your health care provider what activities are safe for you.  Take over-the-counter and prescription medicines only as told by your health care provider.  If you smoke, do not smoke without supervision.  Keep all follow-up visits as told by your health care provider. This is important. Contact a health care provider if:  You have nausea or vomiting that does not get better with medicine.  You cannot eat or drink without vomiting.  You have pain that does not get better with medicine.  You are unable to pass urine.  You develop a skin rash.  You have a fever.  You have redness around your IV site that gets worse. Get help right away if:  You have difficulty breathing.  You have chest pain.  You have blood in your urine or stool, or you vomit blood. Summary  After the procedure, it is common to have a sore throat or nausea. It is also common to feel tired.  Have a responsible adult stay with you for the first 24 hours after general anesthesia. It is important to have someone help care for you until you are awake and alert.  When you feel hungry, start by eating small amounts of foods that are soft and easy to digest (bland), such as toast. Gradually return to your regular diet.  Drink enough fluid to keep your urine pale yellow.  Return to your normal activities as told by your health care provider. Ask your health care provider what activities are safe for you. This information is not   intended to replace advice given to you by your health care provider. Make sure you discuss any questions you have with your health care provider. Document Revised: 04/13/2017 Document Reviewed: 11/24/2016 Elsevier Patient Education  2020 Elsevier Inc.  

## 2019-08-25 ENCOUNTER — Ambulatory Visit: Payer: 59 | Admitting: Anesthesiology

## 2019-08-25 ENCOUNTER — Encounter: Payer: Self-pay | Admitting: Gastroenterology

## 2019-08-25 ENCOUNTER — Encounter
Admission: RE | Disposition: A | Discharge status: Home / Self Care | Payer: Self-pay | Source: Home / Self Care | Attending: Gastroenterology

## 2019-08-25 ENCOUNTER — Ambulatory Visit
Admission: RE | Admit: 2019-08-25 | Discharge: 2019-08-25 | Disposition: A | Discharge status: Home / Self Care | Payer: 59 | Attending: Gastroenterology | Admitting: Gastroenterology

## 2019-08-25 ENCOUNTER — Other Ambulatory Visit: Payer: Self-pay

## 2019-08-25 DIAGNOSIS — Z09 Encounter for follow-up examination after completed treatment for conditions other than malignant neoplasm: Secondary | ICD-10-CM | POA: Diagnosis not present

## 2019-08-25 DIAGNOSIS — M170 Bilateral primary osteoarthritis of knee: Secondary | ICD-10-CM | POA: Diagnosis not present

## 2019-08-25 DIAGNOSIS — Z83719 Family history of colon polyps, unspecified: Secondary | ICD-10-CM

## 2019-08-25 DIAGNOSIS — Z853 Personal history of malignant neoplasm of breast: Secondary | ICD-10-CM | POA: Insufficient documentation

## 2019-08-25 DIAGNOSIS — M160 Bilateral primary osteoarthritis of hip: Secondary | ICD-10-CM | POA: Diagnosis not present

## 2019-08-25 DIAGNOSIS — Z8371 Family history of colonic polyps: Secondary | ICD-10-CM | POA: Insufficient documentation

## 2019-08-25 DIAGNOSIS — Z923 Personal history of irradiation: Secondary | ICD-10-CM | POA: Diagnosis not present

## 2019-08-25 DIAGNOSIS — Z91040 Latex allergy status: Secondary | ICD-10-CM | POA: Diagnosis not present

## 2019-08-25 DIAGNOSIS — Z85820 Personal history of malignant melanoma of skin: Secondary | ICD-10-CM | POA: Insufficient documentation

## 2019-08-25 HISTORY — DX: Motion sickness, initial encounter: T75.3XXA

## 2019-08-25 HISTORY — DX: Unspecified osteoarthritis, unspecified site: M19.90

## 2019-08-25 HISTORY — DX: Presence of spectacles and contact lenses: Z97.3

## 2019-08-25 HISTORY — PX: COLONOSCOPY WITH PROPOFOL: SHX5780

## 2019-08-25 SURGERY — COLONOSCOPY WITH PROPOFOL
Anesthesia: General | Site: Rectum

## 2019-08-25 MED ORDER — OXYCODONE HCL 5 MG PO TABS: 5.0000 mg | ORAL_TABLET | Freq: Once | ORAL | Status: DC | PRN

## 2019-08-25 MED ORDER — LACTATED RINGERS IV SOLN
10.0000 mL/h | INTRAVENOUS | Status: DC
  Administered 2019-08-25: 10 mL/h via INTRAVENOUS

## 2019-08-25 MED ORDER — FENTANYL CITRATE (PF) 100 MCG/2ML IJ SOLN: 25.0000 ug | INTRAMUSCULAR | Status: DC | PRN

## 2019-08-25 MED ORDER — LIDOCAINE HCL (CARDIAC) PF 100 MG/5ML IV SOSY
PREFILLED_SYRINGE | INTRAVENOUS | Status: DC | PRN
  Administered 2019-08-25: 20 mg via INTRAVENOUS

## 2019-08-25 MED ORDER — PROMETHAZINE HCL 25 MG/ML IJ SOLN
6.2500 mg | INTRAMUSCULAR | Status: DC | PRN
Start: 2019-08-25 — End: 2019-08-25

## 2019-08-25 MED ORDER — MEPERIDINE HCL 25 MG/ML IJ SOLN: 6.2500 mg | INTRAMUSCULAR | Status: DC | PRN

## 2019-08-25 MED ORDER — OXYCODONE HCL 5 MG/5ML PO SOLN: 5.0000 mg | Freq: Once | ORAL | Status: DC | PRN

## 2019-08-25 MED ORDER — PROPOFOL 10 MG/ML IV BOLUS
INTRAVENOUS | Status: DC | PRN
  Administered 2019-08-25: 100 mg via INTRAVENOUS
  Administered 2019-08-25 (×4): 20 mg via INTRAVENOUS
  Administered 2019-08-25: 30 mg via INTRAVENOUS
  Administered 2019-08-25 (×3): 20 mg via INTRAVENOUS

## 2019-08-25 MED ORDER — STERILE WATER FOR IRRIGATION IR SOLN
Status: DC | PRN
  Administered 2019-08-25: .05 mL

## 2019-08-25 SURGICAL SUPPLY — 4 items
GOWN CVR UNV OPN BCK APRN NK (MISCELLANEOUS) ×4 IMPLANT
GOWN ISOL THUMB LOOP REG UNIV (MISCELLANEOUS) ×2
KIT ENDO PROCEDURE OLY (KITS) ×3 IMPLANT
WATER STERILE IRR 250ML POUR (IV SOLUTION) ×3 IMPLANT

## 2019-08-25 NOTE — H&P (Signed)
Alyssa Lame, MD [facility] [address] [place], [place] [address] Phone:[phone] Fax : [phone]  Primary Care Physician:  Olin Hauser, DO Primary Gastroenterologist:  Dr. Allen Norris  Pre-Procedure History & Physical: HPI:  Alyssa Bradford is a 59 y.o. female is here for an colonoscopy.   Past Medical History:  Diagnosis Date  . Arthritis    hips and knees  . Breast cancer [facility]) 2011   right breast with lumpectomy and rad tx in [place]  . 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 Tamoxifen x 3 years, then exemestane. [facility], [place], [place].   . Melanoma ([place])   . Motion sickness   . Nephrolithiasis   . Personal history of radiation therapy   . Wears contact lenses     Past Surgical History:  Procedure Laterality Date  . ABDOMINAL HYSTERECTOMY  1994   partial - still has cervix, ovaries  . BLADDER SUSPENSION     approximately 25 yrs ago  . BREAST BIOPSY Left 2001   benign  . BREAST EXCISIONAL BIOPSY Right 2011   + with rad tx   . BREAST LUMPECTOMY  2011  . BREAST SURGERY  2001   BIOPSY  . COLONOSCOPY WITH PROPOFOL N/A 07/21/2015   Procedure: COLONOSCOPY WITH PROPOFOL;  Surgeon: Christene Lye, MD;  Location: [facility] ENDOSCOPY;  Service: Endoscopy;  Laterality: N/A;  . CYSTOCELE REPAIR N/A 01/22/2018   Procedure: ANTERIOR REPAIR (CYSTOCELE);  Surgeon: Bjorn Loser, MD;  [facility];  Service: Urology;  Laterality: N/A;  . CYSTOSCOPY N/A 01/22/2018   Procedure: CYSTOSCOPY;  Surgeon: Bjorn Loser, MD;  Location[facility];  Service: Urology;  Laterality: N/A;  . NM PET DX MELANOMA    . TONSILLECTOMY      Prior to Admission medications   Medication Sig Start Date End Date Taking? Authorizing Provider  OVER THE COUNTER MEDICATION Take 1 tablet by mouth at bedtime as needed (insomnia). [employer] Sleep Aid   Yes [provider]    polyethylene glycol-electrolytes (NULYTELY) 420 g solution Drink one 8oz glass every 20 mins until entire container is finished starting at 5:00pm on 08/24/19 08/22/19   Alyssa Lame, MD    Allergies as of 05/14/2019 - Review Complete 05/13/2019  Allergen Reaction Noted  . Latex Rash 02/22/2015    Family History  Problem Relation Age of Onset  . Cancer Father        BLADDER /SKIN   . Heart disease Father        Stent placement & CABG x 3  . Heart attack Father 94  . Breast cancer Maternal Grandmother        70's  . Breast cancer Paternal Grandmother        83's    Social History   Socioeconomic History  . Marital status: Married    Spouse name: Not on file  . Number of children: Not on file  . Years of education: Not on file  . Highest education level: Not on file  Occupational History  . Not on file  Tobacco Use  . Smoking status: Never Smoker  . Smokeless tobacco: Never Used  Substance and Sexual Activity  . Alcohol use: Yes    Comment: occasional  . Drug use: No  . Sexual activity: Not on file  Other Topics Concern  . Not on file  Social History Narrative  . Not on file  Social Determinants of Health   Financial Resource Strain:   . Difficulty of Paying Living Expenses:   Food Insecurity:   . Worried About Charity fundraiser in the Last Year:   . Arboriculturist in the Last Year:   Transportation Needs:   . Film/video editor (Medical):   Marland Kitchen Lack of Transportation (Non-Medical):   Physical Activity:   . Days of Exercise per Week:   . Minutes of Exercise per Session:   Stress:   . Feeling of Stress :   Social Connections:   . Frequency of Communication with Friends and Family:   . Frequency of Social Gatherings with Friends and Family:   . Attends Religious Services:   . Active Member of Clubs or Organizations:   . Attends Archivist Meetings:   Marland Kitchen Marital Status:   Intimate Partner Violence:   . Fear of Current or Ex-Partner:   .  Emotionally Abused:   Marland Kitchen Physically Abused:   . Sexually Abused:     Review of Systems: See HPI, otherwise negative ROS  Physical Exam: Ht 5\' 8"  (1.727 m)   Wt 71.7 kg   BMI 24.02 kg/m  General:   Alert,  pleasant and cooperative in NAD Head:  Normocephalic and atraumatic. Neck:  Supple; no masses or thyromegaly. Lungs:  Clear throughout to auscultation.    Heart:  Regular rate and rhythm. Abdomen:  Soft, nontender and nondistended. Normal bowel sounds, without guarding, and without rebound.   Neurologic:  Alert and  oriented x4;  grossly normal neurologically.  Impression/Plan: Brennah Himelright is here for an colonoscopy to be performed for family history of polyps  Risks, benefits, limitations, and alternatives regarding  colonoscopy have been reviewed with the patient.  Questions have been answered.  All parties agreeable.   Alyssa Lame, MD  08/25/2019, 7:23 AM

## 2019-08-25 NOTE — Anesthesia Preprocedure Evaluation (Signed)
Anesthesia Evaluation  Patient identified by MRN, date of birth, ID band Patient awake    Reviewed: Allergy & Precautions, H&P , NPO status , Patient's Chart, lab work & pertinent test results, reviewed documented beta blocker date and time   History of Anesthesia Complications (+) PONV  Airway Mallampati: II  TM Distance: >3 FB Neck ROM: full    Dental no notable dental hx.    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 Tamoxifen x 3 years, then exemestane. [facility], [place], [place].     Anesthesia Other Findings   Reproductive/Obstetrics negative OB ROS                             Anesthesia Physical Anesthesia Plan  ASA: II  Anesthesia Plan: General   Post-op Pain Management:    Induction:   PONV Risk Score and Plan: 4 or greater and Propofol infusion, TIVA and Treatment may vary due to age or medical condition  Airway Management Planned:   Additional Equipment:   Intra-op Plan:   Post-operative Plan:   Informed Consent: I have reviewed the patients History and Physical, chart, labs and discussed the procedure including the risks, benefits and alternatives for the proposed anesthesia with the patient or authorized representative who has indicated his/her understanding and acceptance.     Dental Advisory Given  Plan Discussed with:  CRNA  Anesthesia Plan Comments:         Anesthesia Quick Evaluation

## 2019-08-25 NOTE — Anesthesia Procedure Notes (Signed)
Procedure Name: MAC Date/Time: 08/25/2019 8:15 AM Performed by: Vanetta Shawl, CRNA Pre-anesthesia Checklist: Patient identified, Emergency Drugs available, Suction available, Timeout performed and Patient being monitored Patient Re-evaluated:Patient Re-evaluated prior to induction Oxygen Delivery Method: Nasal cannula Placement Confirmation: positive ETCO2

## 2019-08-25 NOTE — Op Note (Signed)
[facility] Gastroenterology Patient Name: Alyssa Bradford Procedure Date: 08/25/2019 8:12 AM MRN: [id number] Account #: [id number] Date of Birth: 01-24-1961 Admit Type: Outpatient Age: 59 Room: [facility] OR ROOM 01 Gender: Female Note Status: Finalized Procedure:             Colonoscopy Indications:           Family history of advanced adenoma of the colon in a                         first-degree relative before age 36 years Providers:             Lucilla Lame MD, MD Referring MD:          Olin Hauser (Referring MD) Medicines:             Propofol per Anesthesia Complications:         No immediate complications. Procedure:             Pre-Anesthesia Assessment:                        - Prior to the procedure, a History and Physical was                         performed, and patient medications and allergies were                         reviewed. The patient's tolerance of previous                         anesthesia was also reviewed. The risks and benefits                         of the procedure and the sedation options and risks                         were discussed with the patient. All questions were                         answered, and informed consent was obtained. Prior                         Anticoagulants: The patient has taken no previous                         anticoagulant or antiplatelet agents. ASA Grade                         Assessment: II - A patient with mild systemic disease.                         After reviewing the risks and benefits, the patient                         was deemed in satisfactory condition to undergo the                         procedure.  After obtaining informed consent, the colonoscope was                         passed under direct vision. Throughout the procedure,                         the patient's blood pressure, pulse, and oxygen                         saturations were monitored  continuously. The                         Colonoscope was introduced through the anus and                         advanced to the the cecum, identified by appendiceal                         orifice and ileocecal valve. The colonoscopy was                         performed without difficulty. The patient tolerated                         the procedure well. The quality of the bowel                         preparation was excellent. Findings:      The perianal and digital rectal examinations were normal.      The colon (entire examined portion) appeared normal. Impression:            - The entire examined colon is normal.                        - No specimens collected. Recommendation:        - Discharge patient to home.                        - Resume previous diet.                        - Continue present medications.                        - Repeat colonoscopy in 5 years for surveillance. Procedure Code(s):     --- Professional ---                        [phone], Colonoscopy, flexible; diagnostic, including                         collection of specimen(s) by brushing or washing, when                         performed (separate procedure) Diagnosis Code(s):     --- Professional ---                        Z83.71, Family history of colonic polyps CPT copyright 2019 American Medical Association. All rights reserved. The codes documented in this  report are preliminary and upon coder review may  be revised to meet current compliance requirements. Lucilla Lame MD, MD 08/25/2019 8:34:40 AM This report has been signed electronically. Number of Addenda: 0 Note Initiated On: 08/25/2019 8:12 AM Scope Withdrawal Time: 0 hours 7 minutes 59 seconds  Total Procedure Duration: 0 hours 15 minutes 36 seconds  Estimated Blood Loss:  Estimated blood loss: none.      [facility]

## 2019-08-25 NOTE — Anesthesia Postprocedure Evaluation (Signed)
Anesthesia Post Note  Patient: Alyssa Bradford  Procedure(s) Performed: COLONOSCOPY WITH PROPOFOL (N/A Rectum)     Patient location during evaluation: PACU Anesthesia Type: General Level of consciousness: awake and alert Pain management: pain level controlled Vital Signs Assessment: post-procedure vital signs reviewed and stable Respiratory status: spontaneous breathing, nonlabored ventilation, respiratory function stable and patient connected to nasal cannula oxygen Cardiovascular status: blood pressure returned to baseline and stable Postop Assessment: no apparent nausea or vomiting Anesthetic complications: no    Akshay Spang ELAINE

## 2019-08-25 NOTE — Transfer of Care (Signed)
Immediate Anesthesia Transfer of Care Note  Patient: Alyssa Bradford  Procedure(s) Performed: COLONOSCOPY WITH PROPOFOL (N/A Rectum)  Patient Location: PACU  Anesthesia Type: General  Level of Consciousness: awake, alert  and patient cooperative  Airway and Oxygen Therapy: Patient Spontanous Breathing and Patient connected to supplemental oxygen  Post-op Assessment: Post-op Vital signs reviewed, Patient's Cardiovascular Status Stable, Respiratory Function Stable, Patent Airway and No signs of Nausea or vomiting  Post-op Vital Signs: Reviewed and stable  Complications: No apparent anesthesia complications

## 2019-08-26 ENCOUNTER — Encounter: Payer: Self-pay | Admitting: *Deleted

## 2019-09-30 ENCOUNTER — Ambulatory Visit
Admission: RE | Admit: 2019-09-30 | Discharge: 2019-09-30 | Disposition: A | Discharge status: Home / Self Care | Payer: 59 | Source: Ambulatory Visit | Attending: Family Medicine | Admitting: Family Medicine

## 2019-09-30 DIAGNOSIS — Z1231 Encounter for screening mammogram for malignant neoplasm of breast: Secondary | ICD-10-CM | POA: Diagnosis present

## 2019-10-30 ENCOUNTER — Encounter (HOSPITAL_COMMUNITY): Payer: Self-pay

## 2019-10-30 ENCOUNTER — Emergency Department (HOSPITAL_COMMUNITY)
Admission: EM | Admit: 2019-10-30 | Discharge: 2019-10-30 | Disposition: A | Discharge status: Home / Self Care | Payer: 59 | Attending: Emergency Medicine | Admitting: Emergency Medicine

## 2019-10-30 ENCOUNTER — Other Ambulatory Visit: Payer: Self-pay

## 2019-10-30 ENCOUNTER — Emergency Department (HOSPITAL_COMMUNITY): Payer: 59

## 2019-10-30 DIAGNOSIS — Z9012 Acquired absence of left breast and nipple: Secondary | ICD-10-CM | POA: Insufficient documentation

## 2019-10-30 DIAGNOSIS — K529 Noninfective gastroenteritis and colitis, unspecified: Secondary | ICD-10-CM | POA: Diagnosis not present

## 2019-10-30 DIAGNOSIS — M549 Dorsalgia, unspecified: Secondary | ICD-10-CM | POA: Insufficient documentation

## 2019-10-30 DIAGNOSIS — R1031 Right lower quadrant pain: Secondary | ICD-10-CM | POA: Diagnosis present

## 2019-10-30 DIAGNOSIS — Z853 Personal history of malignant neoplasm of breast: Secondary | ICD-10-CM | POA: Insufficient documentation

## 2019-10-30 DIAGNOSIS — Z91040 Latex allergy status: Secondary | ICD-10-CM | POA: Diagnosis not present

## 2019-10-30 LAB — COMPREHENSIVE METABOLIC PANEL
ALT: 13 U/L (ref 0–44)
AST: 26 U/L (ref 15–41)
Albumin: 5 g/dL (ref 3.5–5.0)
Alkaline Phosphatase: 61 U/L (ref 38–126)
Anion gap: 13 (ref 5–15)
BUN: 17 mg/dL (ref 6–20)
CO2: 27 mmol/L (ref 22–32)
Calcium: 9.8 mg/dL (ref 8.9–10.3)
Chloride: 100 mmol/L (ref 98–111)
Creatinine, Ser: 0.79 mg/dL (ref 0.44–1.00)
GFR calc Af Amer: >60 mL/min (ref >60)
GFR calc non Af Amer: >60 mL/min (ref >60)
Glucose, Bld: 134 mg/dL — ABNORMAL HIGH (ref 70–99)
Potassium: 3.8 mmol/L (ref 3.5–5.1)
Sodium: 140 mmol/L (ref 135–145)
Total Bilirubin: 1 mg/dL (ref 0.3–1.2)
Total Protein: 8 g/dL (ref 6.5–8.1)

## 2019-10-30 LAB — CBC
HCT: 44.5 % (ref 36.0–46.0)
Hemoglobin: 15.4 g/dL — ABNORMAL HIGH (ref 12.0–15.0)
MCH: 31.8 pg (ref 26.0–34.0)
MCHC: 34.6 g/dL (ref 30.0–36.0)
MCV: 91.8 fL (ref 80.0–100.0)
Platelets: 269 10*3/uL (ref 150–400)
RBC: 4.85 MIL/uL (ref 3.87–5.11)
RDW: 11.9 % (ref 11.5–15.5)
WBC: 12.3 10*3/uL — ABNORMAL HIGH (ref 4.0–10.5)
nRBC: 0 % (ref 0.0–0.2)

## 2019-10-30 LAB — URINALYSIS, ROUTINE W REFLEX MICROSCOPIC
Bilirubin Urine: NEGATIVE
Glucose, UA: NEGATIVE mg/dL
Hgb urine dipstick: NEGATIVE
Ketones, ur: NEGATIVE mg/dL
Leukocytes,Ua: NEGATIVE
Nitrite: NEGATIVE
Protein, ur: NEGATIVE mg/dL
Specific Gravity, Urine: >1.046 — ABNORMAL HIGH (ref 1.005–1.030)
pH: 7 (ref 5.0–8.0)

## 2019-10-30 LAB — I-STAT BETA HCG BLOOD, ED (MC, WL, AP ONLY): I-stat hCG, quantitative: <5 m[IU]/mL (ref <5)

## 2019-10-30 LAB — LIPASE, BLOOD: Lipase: 32 U/L (ref 11–51)

## 2019-10-30 MED ORDER — SODIUM CHLORIDE (PF) 0.9 % IJ SOLN
INTRAMUSCULAR | Status: AC
  Filled 2019-10-30: qty 50

## 2019-10-30 MED ORDER — DICYCLOMINE HCL 20 MG PO TABS
20.0000 mg | ORAL_TABLET | Freq: Two times a day (BID) | ORAL | 0 refills | Status: DC
Start: 2019-10-30 — End: 2020-03-17

## 2019-10-30 MED ORDER — SODIUM CHLORIDE 0.9% FLUSH: 3.0000 mL | Freq: Once | INTRAVENOUS | Status: DC

## 2019-10-30 MED ORDER — ONDANSETRON HCL 4 MG/2ML IJ SOLN
4.0000 mg | Freq: Once | INTRAMUSCULAR | Status: AC
  Administered 2019-10-30: 4 mg via INTRAVENOUS
  Filled 2019-10-30: qty 2

## 2019-10-30 MED ORDER — SODIUM CHLORIDE 0.9 % IV BOLUS
1000.0000 mL | Freq: Once | INTRAVENOUS | Status: AC
  Administered 2019-10-30: 1000 mL via INTRAVENOUS

## 2019-10-30 MED ORDER — ONDANSETRON 4 MG PO TBDP
4.0000 mg | ORAL_TABLET | Freq: Three times a day (TID) | ORAL | 0 refills | Status: DC | PRN
Start: 2019-10-30 — End: 2020-03-17

## 2019-10-30 MED ORDER — IOHEXOL 300 MG/ML  SOLN
100.0000 mL | Freq: Once | INTRAMUSCULAR | Status: AC | PRN
  Administered 2019-10-30: 100 mL via INTRAVENOUS

## 2019-10-30 MED ORDER — FENTANYL CITRATE (PF) 100 MCG/2ML IJ SOLN
50.0000 ug | Freq: Once | INTRAMUSCULAR | Status: AC
  Administered 2019-10-30: 50 ug via INTRAVENOUS
  Filled 2019-10-30: qty 2

## 2019-10-30 NOTE — ED Provider Notes (Signed)
[facility] DEPT Provider Note   CSN: [id number] Arrival date & time: 10/30/19  0450     History Chief Complaint  Patient presents with  . Abdominal Pain    Alyssa Bradford is a 59 y.o. female with pertinent past medical history of arthritis, nephrolithiasis that presents to the emergency department today for right lower quadrant abdominal pain.  Patient states that she started having abdominal pain last night after eating dinner.  Patient states that she ate a hot dog for dinner.  States that she started having cramping after this, was unable to go to sleep due to the pain.  States that pain is intermittent, feels like she is having a contraction.  Also admits to one episode of nonbilious nonbloody vomiting last night.  Also admits to a couple episodes of diarrhea, nonbloody.  Patient states that she also has pain that radiates to her right lower back.  Denies any abdominal history, except for hysterectomy and bladder suspension about 30 years ago.  States the pain is a10/10 when she starts having this contraction.  Contraction loss for couple seconds and occurs a couple times in an hour.  Also admits to chills and feeling hot.  Denies any chest pain, shortness of breath, URI-like symptoms, headache, vision changes, weakness, paresthesias. Denies any dysuria or hematuria.  No recent antibiotics.  HPI     Past Medical History:  Diagnosis Date  . Arthritis    hips and knees  . Breast cancer [facility]) 2011   right breast with lumpectomy and rad tx in [place]  . 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 Tamoxifen x 3 years, then exemestane. [facility], [place], [place].   . Melanoma ([place])   . Motion sickness   . Nephrolithiasis   . Personal history of radiation therapy   . Wears contact lenses     Patient Active Problem List   Diagnosis Date Noted  . Family history of  colonic polyps   . Cystocele with prolapse 01/22/2018  . History of melanoma excision 12/05/2017  . Bladder prolapse, female, acquired 11/09/2017  . Primary osteoarthritis involving multiple joints 11/09/2017  . Chronic left hip pain 11/09/2017  . 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 Palpitation 02/22/2015    Past Surgical History:  Procedure Laterality Date  . ABDOMINAL HYSTERECTOMY  1994   partial - still has cervix, ovaries  . BLADDER SUSPENSION     approximately 25 yrs ago  . BREAST BIOPSY Left 2001   benign  . BREAST EXCISIONAL BIOPSY Right 2011   + with rad tx   . BREAST LUMPECTOMY  2011  . BREAST SURGERY  2001   BIOPSY  . COLONOSCOPY WITH PROPOFOL N/A 07/21/2015   Procedure: COLONOSCOPY WITH PROPOFOL;  Surgeon: Christene Lye, MD;  Location: [facility] ENDOSCOPY;  Service: Endoscopy;  Laterality: N/A;  . COLONOSCOPY WITH PROPOFOL N/A 08/25/2019   Procedure: COLONOSCOPY WITH PROPOFOL;  Surgeon: Lucilla Lame, MD;  Location: [facility];  Service: Endoscopy;  Laterality: N/A;  . CYSTOCELE REPAIR N/A 01/22/2018   Procedure: ANTERIOR REPAIR (CYSTOCELE);  Surgeon: Bjorn Loser, MD;  Location: [facility];  Service: Urology;  Laterality: N/A;  . CYSTOSCOPY N/A 01/22/2018   Procedure: CYSTOSCOPY;  Surgeon: Bjorn Loser, MD;  Location: [facility];  Service: Urology;  Laterality: N/A;  . NM PET DX MELANOMA    . TONSILLECTOMY  OB History   No obstetric history on file.     Family History  Problem Relation Age of Onset  . Cancer Father        BLADDER /SKIN   . Heart disease Father        Stent placement & CABG x 3  . Heart attack Father 24  . Breast cancer Maternal Grandmother        70's  . Breast cancer Paternal Grandmother        25's    Social History   Tobacco Use  . Smoking status: Never Smoker  . Smokeless tobacco: Never Used  Vaping Use  . Vaping Use: Never used  Substance Use Topics  . Alcohol use: Yes     Comment: occasional  . Drug use: No    Home Medications Prior to Admission medications   Medication Sig Start Date End Date Taking? Authorizing Provider  acetaminophen (TYLENOL) 650 MG CR tablet Take 650 mg by mouth at bedtime.   Yes [provider]  doxylamine, Sleep, (UNISOM) 25 MG tablet Take 25 mg by mouth at bedtime.   Yes [provider]  dicyclomine (BENTYL) 20 MG tablet Take 1 tablet (20 mg total) by mouth 2 (two) times daily. 10/30/19   Alfredia Client, PA-C  ondansetron (ZOFRAN ODT) 4 MG disintegrating tablet Take 1 tablet (4 mg total) by mouth every 8 (eight) hours as needed for nausea or vomiting. 10/30/19   Alfredia Client, PA-C  polyethylene glycol-electrolytes (NULYTELY) 420 g solution Drink one 8oz glass every 20 mins until entire container is finished starting at 5:00pm on 08/24/19 Patient not taking: Reported on 10/30/2019 08/22/19   Lucilla Lame, MD    Allergies    Latex  Review of Systems   Review of Systems  Constitutional: Negative for chills, diaphoresis, fatigue and fever.  HENT: Negative for congestion, sore throat and trouble swallowing.   Eyes: Negative for pain and visual disturbance.  Respiratory: Negative for cough, shortness of breath and wheezing.   Cardiovascular: Negative for chest pain, palpitations and leg swelling.  Gastrointestinal: Positive for abdominal pain, diarrhea, nausea and vomiting. Negative for abdominal distention.  Genitourinary: Negative for difficulty urinating.  Musculoskeletal: Positive for back pain. Negative for gait problem, neck pain and neck stiffness.  Skin: Negative for pallor.  Neurological: Negative for dizziness, speech difficulty, weakness and headaches.  Psychiatric/Behavioral: Negative for confusion.    Physical Exam Updated Vital Signs BP 125/70   Pulse 77   Temp 97.6 F (36.4 C) (Oral)   Resp 16   Ht 5\' 8"  (1.727 m)   Wt 70.3 kg   SpO2 99%   BMI 23.57 kg/m   Physical Exam Constitutional:       General: She is not in acute distress.    Appearance: Normal appearance. She is not ill-appearing, toxic-appearing or diaphoretic.  HENT:     Mouth/Throat:     Mouth: Mucous membranes are dry.     Pharynx: Oropharynx is clear.  Eyes:     General: No scleral icterus.    Extraocular Movements: Extraocular movements intact.     Pupils: Pupils are equal, round, and reactive to light.  Cardiovascular:     Rate and Rhythm: Normal rate and regular rhythm.     Pulses: Normal pulses.     Heart sounds: Normal heart sounds.  Pulmonary:     Effort: Pulmonary effort is normal. No respiratory distress.     Breath sounds: Normal breath sounds. No stridor. No wheezing,  rhonchi or rales.  Chest:     Chest wall: No tenderness.  Abdominal:     General: Abdomen is flat. Bowel sounds are normal. There is no distension.     Palpations: Abdomen is soft.     Tenderness: There is abdominal tenderness in the right lower quadrant. There is no right CVA tenderness, left CVA tenderness, guarding or rebound. Positive signs include Rovsing's sign and McBurney's sign.  Musculoskeletal:        General: No swelling or tenderness. Normal range of motion.     Cervical back: Normal range of motion and neck supple. No rigidity.     Right lower leg: No edema.     Left lower leg: No edema.     Comments: No TTP to back, no midline tenderness.   Skin:    General: Skin is warm and dry.     Capillary Refill: Capillary refill takes less than 2 seconds.     Coloration: Skin is not pale.  Neurological:     General: No focal deficit present.     Mental Status: She is alert and oriented to person, place, and time.  Psychiatric:        Mood and Affect: Mood normal.        Behavior: Behavior normal.     ED Results / Procedures / Treatments   Labs (all labs ordered are listed, but only abnormal results are displayed) Labs Reviewed  COMPREHENSIVE METABOLIC PANEL - Abnormal; Notable for the following components:       Result Value   Glucose, Bld 134 (*)    All other components within normal limits  CBC - Abnormal; Notable for the following components:   WBC 12.3 (*)    Hemoglobin 15.4 (*)    All other components within normal limits  URINALYSIS, ROUTINE W REFLEX MICROSCOPIC - Abnormal; Notable for the following components:   Specific Gravity, Urine >1.046 (*)    All other components within normal limits  LIPASE, BLOOD  I-STAT BETA HCG BLOOD, ED ([facility], [facility], [facility] ONLY)    EKG None  Radiology CT Abdomen Pelvis W Contrast  Result Date: 10/30/2019 CLINICAL DATA:  Right lower quadrant abdominal pain EXAM: CT ABDOMEN AND PELVIS WITH CONTRAST TECHNIQUE: Multidetector CT imaging of the abdomen and pelvis was performed using the standard protocol following bolus administration of intravenous contrast. CONTRAST:  180mL OMNIPAQUE IOHEXOL 300 MG/ML  SOLN COMPARISON:  None. FINDINGS: Lower chest: The visualized heart and pericardium are unremarkable. The visualized lung bases are clear. Hepatobiliary: Liver and gallbladder are unremarkable. Pancreas: Small duodenal diverticulum. Pancreas is otherwise unremarkable Spleen: Unremarkable Adrenals/Urinary Tract: The adrenal glands and kidneys are unremarkable. Perivesicular foreign bodies seen anteriorly likely relate to surgical changes of bladder suspension. The bladder is otherwise unremarkable. Stomach/Bowel: There is mild ascites present. The mid and distal small bowel is distended and fluid filled without a focal point of transition. Additionally, the ascending colon also appears fluid filled. This entire segment demonstrates mild hyperemia. Together, the findings are suggestive of a infectious or inflammatory enterocolitis. The appendix is relatively small, seen on coronal image 38/4 and sagittal image 51/5, and is unremarkable. There is no evidence of obstruction. No pneumatosis or free intraperitoneal gas. No loculated intra-abdominal fluid collections. The stomach is  unremarkable. Vascular/Lymphatic: There is no pathologic lymphadenopathy within the abdomen and pelvis. The abdominal vasculature is age-appropriate. In particular, the a central mesenteric arterial and venous structures are patent. Reproductive: Uterus absent.  No adnexal masses. Other: None significant  Musculoskeletal: The osseous structures are unremarkable. IMPRESSION: Mildly dilated, hyperemic, fluid-filled distal small bowel and proximal colon with mild ascites present most in keeping with an infectious or inflammatory enterocolitis. No evidence of a obstruction, perforation, or focal bowel ischemia. Electronically Signed   By: Fidela Salisbury MD   On: 10/30/2019 09:17    Procedures Procedures (including critical care time)  Medications Ordered in ED Medications  ondansetron [facility]) injection 4 mg (4 mg Intravenous Given 10/30/19 0749)  sodium chloride 0.9 % bolus 1,000 mL (0 mLs Intravenous Stopped 10/30/19 1226)  fentaNYL (SUBLIMAZE) injection 50 mcg (50 mcg Intravenous Given 10/30/19 0748)  iohexol (OMNIPAQUE) 300 MG/ML solution 100 mL (100 mLs Intravenous Contrast Given 10/30/19 0820)    ED Course  I have reviewed the triage vital signs and the nursing notes.  Pertinent labs & imaging results that were available during my care of the patient were reviewed by me and considered in my medical decision making (see chart for details).    MDM Rules/Calculators/A&P                         Aubriegh Minch is a 59 y.o. female with pertinent past medical history of arthritis, nephrolithiasis that presents to the emergency department today for right lower quadrant abdominal pain.  Differential to include appendicitis, nephrolithiasis, pyelonephritis, other intra-abdominal pathology.  Initial interventions included IV Zofran, fentanyl, IV fluids. CBC and CMP done any acute abnormalities.  Urinalysis shows no signs of UTI.   CT abdomen shows infectious of inflamatory enterocolitis. After reassessment pt  states that nausea and pain have resolved. Pt states that she feels much better. Shared decision-making about antibiotics at this time, do not feel like this is necessary.Pt with close PCP follow up.  Patient with symptoms consistent with viral gastroenteritis.  Vitals are stable, no fever.  No signs of dehydration, tolerating PO fluids > 6 oz.  Lungs are clear.   Supportive therapy indicated with return if symptoms worsen.   Doubt need for further emergent work up at this time. I explained the diagnosis and have given explicit precautions to return to the ER including for any other new or worsening symptoms. The patient understands and accepts the medical plan as it's been dictated and I have answered their questions. Discharge instructions concerning home care and prescriptions have been given. The patient is STABLE and is discharged to home in good condition.  I discussed this case with my attending physician who cosigned this note including patient's presenting symptoms, physical exam, and planned diagnostics and interventions. Attending physician stated agreement with plan or made changes to plan which were implemented.   Final Clinical Impression(s) / ED Diagnoses Final diagnoses:  Enterocolitis    Rx / DC Orders ED Discharge Orders         Ordered    ondansetron (ZOFRAN ODT) 4 MG disintegrating tablet  Every 8 hours PRN     Discontinue  Reprint     10/30/19 1211    dicyclomine (BENTYL) 20 MG tablet  2 times daily     Discontinue  Reprint     10/30/19 1211           Alfredia Client, PA-C 10/30/19 Samella Parr, MD 10/31/19 [phone]

## 2019-10-30 NOTE — Discharge Instructions (Signed)
You are seen today for food poisoning, I want you to use the attached instructions.  You can take the Zofran and the Bentyl as prescribed.  I want you to come back to the emergency department if you have any new or worsening concerning symptoms.  I want you to follow-up with your primary care in the next couple of days.  You can use Tylenol as prescribed on the bottle for pain.  Make sure to stay hydrated.

## 2019-10-30 NOTE — ED Notes (Signed)
Pt transported to CT at this time.

## 2019-10-30 NOTE — ED Triage Notes (Signed)
Pt reports intermittent, cramping abdominal pain in her RLQ. States that she noticed it around 10p. Around 2a, she started vomiting. Endorses diarrhea. States that pain radiates to her back as well. A&Ox4. Ambulatory.

## 2020-03-12 ENCOUNTER — Telehealth: Payer: Self-pay

## 2020-03-12 DIAGNOSIS — E7800 Pure hypercholesterolemia, unspecified: Secondary | ICD-10-CM

## 2020-03-12 DIAGNOSIS — R7309 Other abnormal glucose: Secondary | ICD-10-CM

## 2020-03-12 DIAGNOSIS — Z0000 Encounter for general adult medical examination without abnormal findings: Secondary | ICD-10-CM

## 2020-03-12 NOTE — Telephone Encounter (Signed)
Copied from [place] [id number]. Topic: General - Other >> Mar 12, 2020 11:56 AM Celene Kras wrote: Reason for CRM: Pt called and is requesting to have her labs done before her appt on 03/17/20. Please advise.

## 2020-03-12 NOTE — Telephone Encounter (Signed)
Copied from [place] [id number]. Topic: General - Other >> Mar 12, 2020 11:56 AM Celene Kras wrote: Reason for CRM: Pt called and is requesting to have her labs done before her appt on 03/17/20. Please advise.

## 2020-03-12 NOTE — Addendum Note (Signed)
Addended by: Olin Hauser on: 03/12/2020 04:46 PM   Modules accepted: Orders

## 2020-03-12 NOTE — Telephone Encounter (Signed)
Confirm with the patient wants [facility] lab order appt scheduled for Monday Morning for Lab appt and Wednesday for Physical.

## 2020-03-16 LAB — LIPID PANEL
Cholesterol: 193 mg/dL (ref <200)
HDL: 68 mg/dL (ref >=50)
LDL Cholesterol (Calc): 107 mg/dL (calc) — ABNORMAL HIGH
Non-HDL Cholesterol (Calc): 125 mg/dL (calc) (ref <130)
Total CHOL/HDL Ratio: 2.8 (calc) (ref <5.0)
Triglycerides: 89 mg/dL (ref <150)

## 2020-03-16 LAB — CBC WITH DIFFERENTIAL/PLATELET
Absolute Monocytes: 391 cells/uL (ref 200–950)
Basophils Absolute: 69 cells/uL (ref 0–200)
Basophils Relative: 1.5 %
Eosinophils Absolute: 129 cells/uL (ref 15–500)
Eosinophils Relative: 2.8 %
HCT: 40.4 % (ref 35.0–45.0)
Hemoglobin: 13.7 g/dL (ref 11.7–15.5)
Lymphs Abs: 1233 cells/uL (ref 850–3900)
MCH: 31.4 pg (ref 27.0–33.0)
MCHC: 33.9 g/dL (ref 32.0–36.0)
MCV: 92.7 fL (ref 80.0–100.0)
MPV: 10.8 fL (ref 7.5–12.5)
Monocytes Relative: 8.5 %
Neutro Abs: 2778 cells/uL (ref 1500–7800)
Neutrophils Relative %: 60.4 %
Platelets: 220 10*3/uL (ref 140–400)
RBC: 4.36 10*6/uL (ref 3.80–5.10)
RDW: 12.1 % (ref 11.0–15.0)
Total Lymphocyte: 26.8 %
WBC: 4.6 10*3/uL (ref 3.8–10.8)

## 2020-03-16 LAB — COMPLETE METABOLIC PANEL WITH GFR
AG Ratio: 1.9 (calc) (ref 1.0–2.5)
ALT: 9 U/L (ref 6–29)
AST: 24 U/L (ref 10–35)
Albumin: 4.5 g/dL (ref 3.6–5.1)
Alkaline phosphatase (APISO): 61 U/L (ref 37–153)
BUN: 16 mg/dL (ref 7–25)
CO2: 30 mmol/L (ref 20–32)
Calcium: 9.4 mg/dL (ref 8.6–10.4)
Chloride: 103 mmol/L (ref 98–110)
Creat: 0.74 mg/dL (ref 0.50–0.99)
GFR, Est African American: 102 mL/min/{1.73_m2} (ref >=60)
GFR, Est Non African American: 88 mL/min/{1.73_m2} (ref >=60)
Globulin: 2.4 g/dL (calc) (ref 1.9–3.7)
Glucose, Bld: 77 mg/dL (ref 65–99)
Potassium: 4.1 mmol/L (ref 3.5–5.3)
Sodium: 141 mmol/L (ref 135–146)
Total Bilirubin: 0.6 mg/dL (ref 0.2–1.2)
Total Protein: 6.9 g/dL (ref 6.1–8.1)

## 2020-03-16 LAB — HEMOGLOBIN A1C
Hgb A1c MFr Bld: 5.3 % of total Hgb (ref <5.7)
Mean Plasma Glucose: 105 (calc)
eAG (mmol/L): 5.8 (calc)

## 2020-03-16 LAB — TSH: TSH: 2.88 mIU/L (ref 0.40–4.50)

## 2020-03-17 ENCOUNTER — Encounter: Payer: Self-pay | Admitting: Family Medicine

## 2020-03-17 ENCOUNTER — Telehealth: Payer: Self-pay

## 2020-03-17 ENCOUNTER — Ambulatory Visit (INDEPENDENT_AMBULATORY_CARE_PROVIDER_SITE_OTHER): Payer: 59 | Admitting: Family Medicine

## 2020-03-17 ENCOUNTER — Other Ambulatory Visit: Payer: Self-pay

## 2020-03-17 VITALS — BP 129/57 | HR 75 | Temp 97.5°F | Resp 16 | Ht 68.0 in | Wt 166.0 lb

## 2020-03-17 DIAGNOSIS — Z124 Encounter for screening for malignant neoplasm of cervix: Secondary | ICD-10-CM

## 2020-03-17 DIAGNOSIS — Z0000 Encounter for general adult medical examination without abnormal findings: Secondary | ICD-10-CM

## 2020-03-17 DIAGNOSIS — N8189 Other female genital prolapse: Secondary | ICD-10-CM

## 2020-03-17 DIAGNOSIS — R159 Full incontinence of feces: Secondary | ICD-10-CM

## 2020-03-17 MED ORDER — DOXYLAMINE SUCCINATE (SLEEP) 25 MG PO TABS: 25.0000 mg | ORAL_TABLET | Freq: Every evening | ORAL | Status: AC | PRN

## 2020-03-17 NOTE — Telephone Encounter (Signed)
Copied from [place] [phone]. Topic: General - Other >> Mar 17, 2020  3:50 PM Celene Kras wrote: Reason for CRM: Pt calling stating that the providers she was referred to for her GI issues were all surgeons. Pt is requesting to have Dr. Raliegh Ip make another recommendation for her. Please advise.

## 2020-03-17 NOTE — Progress Notes (Signed)
Subjective:    Patient ID: Alyssa Bradford, female    DOB: April 14, 1961, 59 y.o.   MRN: [id number]  Alyssa Bradford is a 59 y.o. female presenting on 03/17/2020 for Annual Exam   HPI   Here for Annual Physical and Lab Review.  Lifestyle BMI >25 She is doing well overall. - Diet: healthy balanced, high fiber cereal, fruits / veggies daily - She walks a lot, 12,000 steps daily - Regarding sleeping, she has some difficulty, takes OTC [employer] Doxalymine 25mg  nightly PRN, helps some but not much. Tried melatonin before.  Additional history  Constipation / Anal Leakage w/ diarrhea vs fecal incontinence Reports episodic issues, with constipation and diarrhea episodes. She had earlier colonoscopy recently 08/2019 due to this problem - but did not see any problems that explained this. She says several days with - She also admits her daughter (age 58 now) she had rectal reconstruction due to prolapse rectum and vaginal prolapse. She has issues with pelvic floor as a result. - She doesn't have significant urge to have BM. But occasionally will get a mucus or narrow stool that can come out with incontinence.  Additionally - Left gluteal / piriformis region pain and radiating issue she thinks maybe sciatica. She had prior x-ray negative for left hip without arthritis and prior CT abdomen 10/2019 negative for MSK issues in back.  Health Maintenance: UTD [place] March, APril 2021, she will get upcoming COVID Booster soon, and has upcoming travel plans to [place] when ready.  Due for Pap smear cervical cancer screening, prior partial hysterectomy 1994. Has cervix, has had pap smear since negative. Now return to GYN needs new referral.  Colonoscopy screening last done 08/25/19, Dr Allen Norris [facility] - clean without any polyps taken, repeat in 5 years, 08/24/24.  Depression screen [facility] 2/9 03/17/2020 12/11/2018 12/05/2017  Decreased Interest 0 0 0  Down, Depressed, Hopeless 0 0 0  PHQ - 2 Score 0 0 0      Past Medical History:  Diagnosis Date  . Arthritis    hips and knees  . Breast cancer [facility]) 2011   right breast with lumpectomy and rad tx in [place]  . 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 Tamoxifen x 3 years, then exemestane. [facility], [place], [place].   . Melanoma ([place])   . Motion sickness   . Nephrolithiasis   . Personal history of radiation therapy   . Wears contact lenses    Past Surgical History:  Procedure Laterality Date  . ABDOMINAL HYSTERECTOMY  1994   partial - still has cervix, ovaries  . BLADDER SUSPENSION     approximately 25 yrs ago  . BREAST BIOPSY Left 2001   benign  . BREAST EXCISIONAL BIOPSY Right 2011   + with rad tx   . BREAST LUMPECTOMY  2011  . BREAST SURGERY  2001   BIOPSY  . COLONOSCOPY WITH PROPOFOL N/A 07/21/2015   Procedure: COLONOSCOPY WITH PROPOFOL;  Surgeon: Christene Lye, MD;  Location: [facility] ENDOSCOPY;  Service: Endoscopy;  Laterality: N/A;  . COLONOSCOPY WITH PROPOFOL N/A 08/25/2019   Procedure: COLONOSCOPY WITH PROPOFOL;  Surgeon: Lucilla Lame, MD;  Location: [facility];  Service: Endoscopy;  Laterality: N/A;  . CYSTOCELE REPAIR N/A 01/22/2018   Procedure: ANTERIOR REPAIR (CYSTOCELE);  Surgeon: Bjorn Loser, MD;  Location: [facility];  Service: Urology;  Laterality: N/A;  . CYSTOSCOPY N/A 01/22/2018  Procedure: CYSTOSCOPY;  Surgeon: Bjorn Loser, MD;  Location: [facility];  Service: Urology;  Laterality: N/A;  . NM PET DX MELANOMA    . TONSILLECTOMY     Social History   Socioeconomic History  . Marital status: Married    Spouse name: Not on file  . Number of children: Not on file  . Years of education: Not on file  . Highest education level: Not on file  Occupational History  . Not on file  Tobacco Use  . Smoking status: Never Smoker  . Smokeless tobacco: Never Used  Vaping Use  . Vaping Use: Never used   Substance and Sexual Activity  . Alcohol use: Yes    Comment: occasional  . Drug use: No  . Sexual activity: Not on file  Other Topics Concern  . Not on file  Social History Narrative  . Not on file   Social Determinants of Health   Financial Resource Strain:   . Difficulty of Paying Living Expenses: Not on file  Food Insecurity:   . Worried About Charity fundraiser in the Last Year: Not on file  . Ran Out of Food in the Last Year: Not on file  Transportation Needs:   . Lack of Transportation (Medical): Not on file  . Lack of Transportation (Non-Medical): Not on file  Physical Activity:   . Days of Exercise per Week: Not on file  . Minutes of Exercise per Session: Not on file  Stress:   . Feeling of Stress : Not on file  Social Connections:   . Frequency of Communication with Friends and Family: Not on file  . Frequency of Social Gatherings with Friends and Family: Not on file  . Attends Religious Services: Not on file  . Active Member of Clubs or Organizations: Not on file  . Attends Archivist Meetings: Not on file  . Marital Status: Not on file  Intimate Partner Violence:   . Fear of Current or Ex-Partner: Not on file  . Emotionally Abused: Not on file  . Physically Abused: Not on file  . Sexually Abused: Not on file   Family History  Problem Relation Age of Onset  . Cancer Father        BLADDER /SKIN   . Heart disease Father        Stent placement & CABG x 3  . Heart attack Father 65  . Breast cancer Maternal Grandmother        70's  . Breast cancer Paternal Grandmother        29's   No current outpatient medications on file prior to visit.   No current facility-administered medications on file prior to visit.    Review of Systems  Constitutional: Negative for activity change, appetite change, chills, diaphoresis, fatigue and fever.  HENT: Negative for congestion and hearing loss.   Eyes: Negative for visual disturbance.  Respiratory:  Negative for cough, chest tightness, shortness of breath and wheezing.   Cardiovascular: Negative for chest pain, palpitations and leg swelling.  Gastrointestinal: Positive for constipation and diarrhea. Negative for abdominal pain, nausea and vomiting.       Fecal incontinence  Endocrine: Negative for cold intolerance.  Genitourinary: Negative for difficulty urinating, dysuria, frequency and hematuria.  Musculoskeletal: Negative for arthralgias and neck pain.  Skin: Negative for rash.  Allergic/Immunologic: Negative for environmental allergies.  Neurological: Negative for dizziness, weakness, light-headedness, numbness and headaches.  Hematological: Negative for adenopathy.  Psychiatric/Behavioral: Negative for behavioral problems,  dysphoric mood and sleep disturbance.   Per HPI unless specifically indicated above     Objective:    BP (!) 129/57   Pulse 75   Temp (!) 97.5 F (36.4 C) (Temporal)   Resp 16   Ht 5\' 8"  (1.727 m)   Wt 166 lb (75.3 kg)   SpO2 100%   BMI 25.24 kg/m   Wt Readings from Last 3 Encounters:  03/17/20 166 lb (75.3 kg)  10/30/19 155 lb (70.3 kg)  08/25/19 161 lb (73 kg)    Physical Exam Vitals and nursing note reviewed.  Constitutional:      General: She is not in acute distress.    Appearance: She is well-developed. She is not diaphoretic.     Comments: Well-appearing, comfortable, cooperative  HENT:     Head: Normocephalic and atraumatic.  Eyes:     General:        Right eye: No discharge.        Left eye: No discharge.     Conjunctiva/sclera: Conjunctivae normal.     Pupils: Pupils are equal, round, and reactive to light.  Neck:     Thyroid: No thyromegaly.     Vascular: No carotid bruit.  Cardiovascular:     Rate and Rhythm: Normal rate and regular rhythm.     Heart sounds: Normal heart sounds. No murmur heard.   Pulmonary:     Effort: Pulmonary effort is normal. No respiratory distress.     Breath sounds: Normal breath sounds. No  wheezing or rales.  Abdominal:     General: Bowel sounds are normal. There is no distension.     Palpations: Abdomen is soft. There is no mass.     Tenderness: There is no abdominal tenderness.  Musculoskeletal:        General: No tenderness. Normal range of motion.     Cervical back: Normal range of motion and neck supple.     Right lower leg: No edema.     Left lower leg: No edema.     Comments: Upper / Lower Extremities: - Normal muscle tone, strength bilateral upper extremities 5/5, lower extremities 5/5  Lymphadenopathy:     Cervical: No cervical adenopathy.  Skin:    General: Skin is warm and dry.     Findings: No erythema or rash.  Neurological:     Mental Status: She is alert and oriented to person, place, and time.     Comments: Distal sensation intact to light touch all extremities  Psychiatric:        Behavior: Behavior normal.     Comments: Well groomed, good eye contact, normal speech and thoughts       Results for orders placed or performed in visit on 03/12/20  Hemoglobin A1c  Result Value Ref Range   Hgb A1c MFr Bld 5.3 <5.7 % of total Hgb   Mean Plasma Glucose 105 (calc)   eAG (mmol/L) 5.8 (calc)  CBC with Differential/Platelet  Result Value Ref Range   WBC 4.6 3.8 - 10.8 Thousand/uL   RBC 4.36 3.80 - 5.10 Million/uL   Hemoglobin 13.7 11.7 - 15.5 g/dL   HCT 40.4 35 - 45 %   MCV 92.7 80.0 - 100.0 fL   MCH 31.4 27.0 - 33.0 pg   MCHC 33.9 32.0 - 36.0 g/dL   RDW 12.1 11.0 - 15.0 %   Platelets 220 140 - 400 Thousand/uL   MPV 10.8 7.5 - 12.5 fL   Neutro Abs 2,778 1,500 -  7,800 cells/uL   Lymphs Abs 1,233 850 - 3,900 cells/uL   Absolute Monocytes 391 200 - 950 cells/uL   Eosinophils Absolute 129 15.0 - 500.0 cells/uL   Basophils Absolute 69 0.0 - 200.0 cells/uL   Neutrophils Relative % 60.4 %   Total Lymphocyte 26.8 %   Monocytes Relative 8.5 %   Eosinophils Relative 2.8 %   Basophils Relative 1.5 %  COMPLETE METABOLIC PANEL WITH GFR  Result Value Ref  Range   Glucose, Bld 77 65 - 99 mg/dL   BUN 16 7 - 25 mg/dL   Creat 0.74 0.50 - 0.99 mg/dL   GFR, Est Non African American 88 > OR = 60 mL/min/1.51m2   GFR, Est African American 102 > OR = 60 mL/min/1.9m2   BUN/Creatinine Ratio NOT APPLICABLE 6 - 22 (calc)   Sodium 141 135 - 146 mmol/L   Potassium 4.1 3.5 - 5.3 mmol/L   Chloride 103 98 - 110 mmol/L   CO2 30 20 - 32 mmol/L   Calcium 9.4 8.6 - 10.4 mg/dL   Total Protein 6.9 6.1 - 8.1 g/dL   Albumin 4.5 3.6 - 5.1 g/dL   Globulin 2.4 1.9 - 3.7 g/dL (calc)   AG Ratio 1.9 1.0 - 2.5 (calc)   Total Bilirubin 0.6 0.2 - 1.2 mg/dL   Alkaline phosphatase (APISO) 61 37 - 153 U/L   AST 24 10 - 35 U/L   ALT 9 6 - 29 U/L  Lipid panel  Result Value Ref Range   Cholesterol 193 <200 mg/dL   HDL 68 > OR = 50 mg/dL   Triglycerides 89 <150 mg/dL   LDL Cholesterol (Calc) 107 (H) mg/dL (calc)   Total CHOL/HDL Ratio 2.8 <5.0 (calc)   Non-HDL Cholesterol (Calc) 125 <130 mg/dL (calc)  TSH  Result Value Ref Range   TSH 2.88 0.40 - 4.50 mIU/L      Assessment & Plan:   Problem List Items Addressed This Visit    None    Visit Diagnoses    Annual physical exam    -  Primary   Cervical cancer screening       Relevant Orders   Ambulatory referral to Obstetrics / Gynecology     Updated Health Maintenance information - UTD Vaccines, next covid booster due soon - Refer to GYN for pap smear -  Next colonoscopy 5 years ,2026 Reviewed recent lab results with patient Encouraged improvement to lifestyle with diet and exercise - LDL near normal at 107, low risk ASCVD score The 10-year ASCVD risk score Mikey Bussing DC Jr., et al., 2013) is: 2.8%  - No cholesterol medication indicated Maintain healthy weight.  #Additional advice - Gave her info on [facility] for colorectal specialists, to discuss her chronic concerns with rectal tone / incontinence, vs pelvic floor issues, if she wants to pursue this referral she may contact us in future.  -  Handout on piriformis exercises, she can follow up to discuss this particular issue further if needed.  Orders Placed This Encounter  Procedures  . Ambulatory referral to Obstetrics / Gynecology    Referral Priority:   Routine    Referral Type:   Consultation    Referral Reason:   Specialty Services Required    Requested Specialty:   Obstetrics and Gynecology    Number of Visits Requested:   1      Meds ordered this encounter  Medications  . doxylamine, Sleep, (UNISOM) 25 MG tablet    Sig: Take  1 tablet (25 mg total) by mouth at bedtime as needed.    Dispense:  30 tablet      Follow up plan: Return in about 1 year (around 03/17/2021) for 1 year fasting lab only then 1 week later Annual Physical.   Nobie Putnam, DO [facility] 03/17/2020, 1:33 PM

## 2020-03-17 NOTE — Telephone Encounter (Signed)
Called patient back.  She said she called [facility] and they told her that they only do surgery and she would need referral from a GI specialist if surgery was needed.  I told her that I am not quite sure this is correct and that she basically needs some sort of formal evaluation for her history of rectal prolapse and incontinence.  She has already seen GI Dr Allen Norris for Colonoscopy that was negative.  I advised she may benefit from outpatient pelvic floor rehab and return to GI, she will contact Dr Allen Norris and I placed order for refer to pelvic floor rehab next.  Nobie Putnam, [facility] 03/17/2020, 4:54 PM

## 2020-03-17 NOTE — Patient Instructions (Addendum)
Thank you for coming to the office today.  All labs are within normal range. Only slightly elevated cholesterol LDL but still considered very close to normal.  ---------------------------------------  Next step is Colorectal Specialist evaluation - for rectal tone and strength and concerns with weak pelvic floor, perhaps they can recommend specialist pelvic floor PT or therapy or possibly other procedural intervention.  [facility] [address] [place], [place] [address] Phone: [phone]  *Speciality - Colorectal / GI - Dr Stark Klein, Dr Leighton Ruff, Dr Michael Boston  Considering possible IBS with mixed constipation / diarrhea.  - Future reconsider Dicyclomine cramping medicine you took before for looser stools - Consider Miralax regimen for constipation  OTC Peppermint Oil (Triple Coated Capsule) 180mg  take one 3 times daily to reduce diarrhea   Please schedule a Follow-up Appointment to: Return in about 1 year (around 03/17/2021) for 1 year fasting lab only then 1 week later Annual Physical.  If you have any other questions or concerns, please feel free to call the office or send a message through [facility]. You may also schedule an earlier appointment if necessary.  Additionally, you may be receiving a survey about your experience at our office within a few days to 1 week by e-mail or mail. We value your feedback.  Nobie Putnam, DO [facility], [facility]      Piriformis Syndrome Rehabilitation Exercises   You may do all of these exercises right away once acute pain starts to improve on medication or treatment.  Piriformis stretch: Lying on your back with both knees bent, rest the ankle of your injured leg over the knee of your uninjured leg. Grasp the thigh of your uninjured leg and pull that knee toward your chest. You will feel a stretch along the buttocks and possibly along the outside of your hip on the injured side.  Hold this for 15 to 30 seconds. Repeat 3 times.  Standing hamstring stretch: Place the heel of your leg on a stool about 15 inches high. Keep your knee straight. Lean forward, bending at the hips until you feel a mild stretch in the back of your thigh. Make sure you do not roll your shoulders and bend at the waist when doing this or you will stretch your lower back instead. Hold the stretch for 15 to 30 seconds. Repeat 3 times.  Pelvic tilt: Lie on your back with your knees bent and your feet flat on the floor. Tighten your abdominal muscles and push your lower back into the floor. Hold this position for 5 seconds, then relax. Do 3 sets of 10.  Partial curl: Lie on your back with your knees bent and your feet flat on the floor. Tighten your stomach muscles and flatten your back against the floor. Tuck your chin to your chest. With your hands stretched out in front of you, curl your upper body forward until your shoulders clear the floor. Hold this position for 3 seconds. Don't hold your breath. It helps to breathe out as you lift your shoulders up. Relax. Repeat 10 times. Build to 3 sets of 10. To challenge yourself, clasp your hands behind your head and keep your elbows out to the side.  Prone hip extension: Lie on your stomach with your legs straight out behind you. Tighten up your buttocks muscles and lift one leg off the floor about 8 inches. Keep your knee straight. Hold for 5 seconds. Then lower your leg and relax. Do  3 sets of 10.  If both legs are affected, you may repeat this exercise for the other leg.     Fat and Cholesterol Restricted Eating Plan Getting too much fat and cholesterol in your diet may cause health problems. Choosing the right foods helps keep your fat and cholesterol at normal levels. This can keep you from getting certain diseases. Your doctor may recommend an eating plan that includes:  Total fat: ______% or less of total calories a day.  Saturated fat: ______% or  less of total calories a day.  Cholesterol: less than _________mg a day.  Fiber: ______g a day. What are tips for following this plan? Meal planning  At meals, divide your plate into four equal parts: ? Fill one-half of your plate with vegetables and green salads. ? Fill one-fourth of your plate with whole grains. ? Fill one-fourth of your plate with low-fat (lean) protein foods.  Eat fish that is high in omega-3 fats at least two times a week. This includes mackerel, tuna, sardines, and salmon.  Eat foods that are high in fiber, such as whole grains, beans, apples, broccoli, carrots, peas, and barley. General tips   Work with your doctor to lose weight if you need to.  Avoid: ? Foods with added sugar. ? Fried foods. ? Foods with partially hydrogenated oils.  Limit alcohol intake to no more than 1 drink a day for nonpregnant women and 2 drinks a day for men. One drink equals 12 oz of beer, 5 oz of wine, or 1 oz of hard liquor. Reading food labels  Check food labels for: ? Trans fats. ? Partially hydrogenated oils. ? Saturated fat (g) in each serving. ? Cholesterol (mg) in each serving. ? Fiber (g) in each serving.  Choose foods with healthy fats, such as: ? Monounsaturated fats. ? Polyunsaturated fats. ? Omega-3 fats.  Choose grain products that have whole grains. Look for the word "whole" as the first word in the ingredient list. Cooking  Cook foods using low-fat methods. These include baking, boiling, grilling, and broiling.  Eat more home-cooked foods. Eat at restaurants and buffets less often.  Avoid cooking using saturated fats, such as butter, cream, palm oil, palm kernel oil, and coconut oil. Recommended foods  Fruits  All fresh, canned (in natural juice), or frozen fruits. Vegetables  Fresh or frozen vegetables (raw, steamed, roasted, or grilled). Green salads. Grains  Whole grains, such as whole wheat or whole grain breads, crackers, cereals, and  pasta. Unsweetened oatmeal, bulgur, barley, quinoa, or brown rice. Corn or whole wheat flour tortillas. Meats and other protein foods  Ground beef (85% or leaner), grass-fed beef, or beef trimmed of fat. Skinless chicken or [place]. Ground chicken or [place]. Pork trimmed of fat. All fish and seafood. Egg whites. Dried beans, peas, or lentils. Unsalted nuts or seeds. Unsalted canned beans. Nut butters without added sugar or oil. Dairy  Low-fat or nonfat dairy products, such as skim or 1% milk, 2% or reduced-fat cheeses, low-fat and fat-free ricotta or cottage cheese, or plain low-fat and nonfat yogurt. Fats and oils  Tub margarine without trans fats. Light or reduced-fat mayonnaise and salad dressings. Avocado. Olive, canola, sesame, or safflower oils. The items listed above may not be a complete list of foods and beverages you can eat. Contact a dietitian for more information. Foods to avoid Fruits  Canned fruit in heavy syrup. Fruit in cream or butter sauce. Fried fruit. Vegetables  Vegetables cooked in cheese, cream, or butter  sauce. [place] vegetables. Grains  White bread. White pasta. White rice. Cornbread. Bagels, pastries, and croissants. Crackers and snack foods that contain trans fat and hydrogenated oils. Meats and other protein foods  Fatty cuts of meat. Ribs, chicken wings, bacon, sausage, bologna, salami, chitterlings, fatback, hot dogs, bratwurst, and packaged lunch meats. Liver and organ meats. Whole eggs and egg yolks. Chicken and [place] with skin. Fried meat. Dairy  Whole or 2% milk, cream, half-and-half, and cream cheese. Whole milk cheeses. Whole-fat or sweetened yogurt. Full-fat cheeses. Nondairy creamers and whipped toppings. Processed cheese, cheese spreads, and cheese curds. Beverages  Alcohol. Sugar-sweetened drinks such as sodas, lemonade, and fruit drinks. Fats and oils  Butter, stick margarine, lard, shortening, ghee, or bacon fat. Coconut, palm kernel, and palm  oils. Sweets and desserts  Corn syrup, sugars, honey, and molasses. Candy. Jam and jelly. Syrup. Sweetened cereals. Cookies, pies, cakes, donuts, muffins, and ice cream. The items listed above may not be a complete list of foods and beverages you should avoid. Contact a dietitian for more information. Summary  Choosing the right foods helps keep your fat and cholesterol at normal levels. This can keep you from getting certain diseases.  At meals, fill one-half of your plate with vegetables and green salads.  Eat high-fiber foods, like whole grains, beans, apples, carrots, peas, and barley.  Limit added sugar, saturated fats, alcohol, and fried foods. This information is not intended to replace advice given to you by your health care provider. Make sure you discuss any questions you have with your health care provider. Document Revised: 12/12/2017 Document Reviewed: 12/26/2016 Elsevier Patient Education  [place].

## 2020-04-01 ENCOUNTER — Ambulatory Visit (INDEPENDENT_AMBULATORY_CARE_PROVIDER_SITE_OTHER): Payer: 59 | Admitting: Obstetrics and Gynecology

## 2020-04-01 ENCOUNTER — Encounter: Payer: Self-pay | Admitting: Obstetrics and Gynecology

## 2020-04-01 ENCOUNTER — Other Ambulatory Visit: Payer: Self-pay

## 2020-04-01 VITALS — BP 121/81 | HR 76 | Ht 68.0 in | Wt 166.2 lb

## 2020-04-01 DIAGNOSIS — N8189 Other female genital prolapse: Secondary | ICD-10-CM

## 2020-04-01 DIAGNOSIS — R159 Full incontinence of feces: Secondary | ICD-10-CM | POA: Diagnosis not present

## 2020-04-01 DIAGNOSIS — Z01419 Encounter for gynecological examination (general) (routine) without abnormal findings: Secondary | ICD-10-CM

## 2020-04-01 NOTE — Progress Notes (Signed)
HPI:      [name] is a 59 y.o. No obstetric history on file. who LMP was No LMP recorded. Patient has had a hysterectomy.  Subjective:   She presents today for her annual examination.  She was sent here by her primary care physician because she needed a "Pap smear". Patient has had a previous hysterectomy and does not think she still has her cervix.  Hysterectomy was for prolapse.  At that time she had a bladder " suspension." She has since had a second bladder suspension approximately 2 years ago and she feels like it is not working as well as the first one. Also states that she occasionally has fecal incontinence and would like to know what kind of doctor to see to undergo further work-up.  She has had a recent colonoscopy without issue. Her significant history also includes breast cancer.  She is a survivor of greater than 10 years.  She did have radiation and tamoxifen.    Hx: The following portions of the patient's history were reviewed and updated as appropriate:             She  has a past medical history of Arthritis, Breast cancer ([place]) (2011), Breast cancer, stage 0, right (02/22/2009), Melanoma ([place]), Motion sickness, Nephrolithiasis, Personal history of radiation therapy, and Wears contact lenses. She does not have any pertinent problems on file. She  has a past surgical history that includes Breast surgery (2001); Tonsillectomy; Breast lumpectomy (2011); NM PET DX MELANOMA; Colonoscopy with propofol (N/A, 07/21/2015); Bladder suspension; Abdominal hysterectomy (1994); Cystocele repair (N/A, 01/22/2018); Cystoscopy (N/A, 01/22/2018); Colonoscopy with propofol (N/A, 08/25/2019); Breast biopsy (Left, 2001); and Breast excisional biopsy (Right, 2011). Her family history includes Breast cancer in her maternal grandmother and paternal grandmother; Cancer in her father; Heart attack (age of onset: 67) in her father; Heart disease in her father. She  reports that she has never smoked. She has  never used smokeless tobacco. She reports current alcohol use. She reports that she does not use drugs. She has a current medication list which includes the following prescription(s): doxylamine (sleep). She is allergic to latex.       Review of Systems:  Review of Systems  Constitutional: Denied constitutional symptoms, night sweats, recent illness, fatigue, fever, insomnia and weight loss.  Eyes: Denied eye symptoms, eye pain, photophobia, vision change and visual disturbance.  Ears/Nose/Throat/Neck: Denied ear, nose, throat or neck symptoms, hearing loss, nasal discharge, sinus congestion and sore throat.  Cardiovascular: Denied cardiovascular symptoms, arrhythmia, chest pain/pressure, edema, exercise intolerance, orthopnea and palpitations.  Respiratory: Denied pulmonary symptoms, asthma, pleuritic pain, productive sputum, cough, dyspnea and wheezing.  Gastrointestinal: Denied, gastro-esophageal reflux, melena, nausea and vomiting.  Genitourinary: See HPI for additional information.  Musculoskeletal: Denied musculoskeletal symptoms, stiffness, swelling, muscle weakness and myalgia.  Dermatologic: Denied dermatology symptoms, rash and scar.  Neurologic: Denied neurology symptoms, dizziness, headache, neck pain and syncope.  Psychiatric: Denied psychiatric symptoms, anxiety and depression.  Endocrine: Denied endocrine symptoms including hot flashes and night sweats.   Meds:   Current Outpatient Medications on File Prior to Visit  Medication Sig Dispense Refill  . doxylamine, Sleep, (UNISOM) 25 MG tablet Take 1 tablet (25 mg total) by mouth at bedtime as needed. 30 tablet    No current facility-administered medications on file prior to visit.          Objective:     Vitals:   04/01/20 1335  BP: 121/81  Pulse: 76  Filed Weights   04/01/20 1335  Weight: 166 lb 3.2 oz (75.4 kg)              Physical examination General NAD, Conversant  HEENT Atraumatic; Op clear with  mmm.  Normo-cephalic. Pupils reactive. Anicteric sclerae  Thyroid/Neck Smooth without nodularity or enlargement. Normal ROM.  Neck Supple.  Skin No rashes, lesions or ulceration. Normal palpated skin turgor. No nodularity.  Breasts: No masses or discharge.  Symmetric.  No axillary adenopathy.  Lungs: Clear to auscultation.No rales or wheezes. Normal Respiratory effort, no retractions.  Heart: NSR.  No murmurs or rubs appreciated. No periferal edema  Abdomen: Soft.  Non-tender.  No masses.  No HSM. No hernia  Extremities: Moves all appropriately.  Normal ROM for age. No lymphadenopathy.  Neuro: Oriented to PPT.  Normal mood. Normal affect.     Pelvic:   Vulva: Normal appearance.  No lesions.  Vagina: No lesions or abnormalities noted.  Atrophy noted  Support:  Second-degree cystocele second-degree rectocele  Urethra No masses tenderness or scarring.  Meatus Normal size without lesions or prolapse.  Cervix:  Surgically absent  Anus: Normal exam.  No lesions.  Perineum: Normal exam.  No lesions.        Bimanual   Uterus:  Surgically absent  Adnexae: No masses.  Non-tender to palpation.  Cul-de-sac: Negative for abnormality.      Assessment:    No obstetric history on file. Patient Active Problem List   Diagnosis Date Noted  . Family history of colonic polyps   . Cystocele with prolapse 01/22/2018  . History of melanoma excision 12/05/2017  . Bladder prolapse, female, acquired 11/09/2017  . Primary osteoarthritis involving multiple joints 11/09/2017  . Chronic left hip pain 11/09/2017  . 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 Palpitation 02/22/2015     1. Well woman exam with routine gynecological exam   2. Incontinence of feces, unspecified fecal incontinence type   3. Pelvic relaxation     Patient has some moderate pelvic relaxation.  She does complain of occasional fecal incontinence.  I have advised GI or colorectal surgeon for further  follow-up.  We have been able to determine that she does not have a cervix.   Plan:            1.  Basic Screening Recommendations The basic screening recommendations for asymptomatic women were discussed with the patient during her visit.  The age-appropriate recommendations were discussed with her and the rational for the tests reviewed.  When I am informed by the patient that another primary care physician has previously obtained the age-appropriate tests and they are up-to-date, only outstanding tests are ordered and referrals given as necessary.  Abnormal results of tests will be discussed with her when all of her results are completed.  Routine preventative health maintenance measures emphasized: Exercise/Diet/Weight control, Tobacco Warnings, Alcohol/Substance use risks and Stress Management Blood work, mammogram follow-up performed by PCP Patient has timed out of significant follow-up for breast cancer (10-year survivor) 2.  GI or colorectal surgeon for fecal incontinence. 3.  Without cervix patient does not require any further Pap smears. Orders No orders of the defined types were placed in this encounter.   No orders of the defined types were placed in this encounter.           F/U  Return for Pt to contact us if symptoms worsen.  Finis Bud, M.D. 04/01/2020 2:23 PM

## 2020-10-12 ENCOUNTER — Ambulatory Visit: Payer: 59 | Attending: Family Medicine | Admitting: Physical Therapy

## 2020-10-12 ENCOUNTER — Other Ambulatory Visit: Payer: Self-pay

## 2020-10-12 ENCOUNTER — Encounter: Payer: Self-pay | Admitting: Physical Therapy

## 2020-10-12 DIAGNOSIS — R2689 Other abnormalities of gait and mobility: Secondary | ICD-10-CM | POA: Diagnosis not present

## 2020-10-12 DIAGNOSIS — M533 Sacrococcygeal disorders, not elsewhere classified: Secondary | ICD-10-CM

## 2020-10-12 NOTE — Patient Instructions (Addendum)
Side sleeping with pillow between knees   __   Follow up with Primary MD on sleep study to rule in or out OSA  Read the articles provided the connection between nocturia and OSA    __   Clam Shell 45 Degrees  Lying with hips and knees bent 45, one pillow between knees and ankles. Heel together, toes apart like ballerina,  Lift knee with exhale while pressing heels together. Be sure pelvis does not roll backward. Do not arch back. Do 20 times, each leg, 2 times per day.     Complimentary stretch: [employer] _ foot over _ thigh, opposite knee straight  6 breaths  * Keep pelvis levelled with tactile cue with hand under back of hips  * Slide the ankle of the supporting foot out to decrease the angle which can help level the pelvis   __   Proper body mechanics with getting out of a chair to decrease strain  on back &pelvic floor   Avoid holding your breath when Getting out of the chair:  Scoot to front part of chair chair Heels behind knees, feet are hip width apart, nose over toes  Inhale like you are smelling roses Exhale to stand

## 2020-10-12 NOTE — Therapy (Signed)
[place] MAIN [facility] SERVICES [address] [place], [place], [address] Phone: [phone]   Fax:  [phone]  Physical Therapy Evaluation  Patient Details  Name: Alyssa Bradford MRN: [id number] Date of Birth: 1960-08-12 Referring Provider (PT): Parks Ranger MD   Encounter Date: 10/12/2020   PT End of Session - 10/12/20 1334     Visit Number 1    Number of Visits 10    Date for PT Re-Evaluation 12/21/20    PT Start Time 1307    PT Stop Time 1400    PT Time Calculation (min) 53 min    Activity Tolerance Patient tolerated treatment well             Past Medical History:  Diagnosis Date   Arthritis    hips and knees   Breast cancer ([place]) 2011   right breast with lumpectomy and rad tx in [place]   Breast cancer, stage 0, 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 [facility]oxifen x 3 [facility], [facility] [place] [place]    [place]    Motion sickness    Nephrolithiasis    Personal history of radiation therapy    Wears contact lenses     Past Surgical History:  Procedure Laterality Date   ABDOMINAL HYSTERECTOMY  1994   partial - still has cervix, ovaries   BLADDER SUSPENSION     approximately 25 yrs ago   BREAST BIOPSY Left 2001   benign   BREAST EXCISIONAL BIOPSY Right 2011   + with rad tx    BREAST LUMPECTOMY  2011   BREAST SURGERY  2001   BIOPSY   COLONOSCOPY WITH PROPOFOL N/A 07/21/2015   Procedure: COLONOSCOPY WITH PROPOFOL;  Surgeon: SChristene Lye MD;  Location: [facility] ENDOSCOPY;  Service: Endoscopy;  Laterality: N/A;   COLONOSCOPY WITH PROPOFOL N/A 08/25/2019   Procedure: COLONOSCOPY WITH PROPOFOL;  Surgeon: [place] Lame MD;  [place]  Service: Endoscopy;  Laterality: N/A;   CYSTOCELE REPAIR N/A 01/22/2018   Procedure: ANTERIOR REPAIR (CYSTOCELE);  Surgeon: MBjorn Loser MD;  Location: [facility] ORS;  Service:  Urology;  Laterality: N/A;   CYSTOSCOPY N/A 01/22/2018   Procedure: CYSTOSCOPY;  Surgeon: MBjorn Loser MD;  Location: [facility] ORS;  Service: Urology;  Laterality: N/A;   NM PET DX MELANOMA     TONSILLECTOMY      There were no vitals filed for this visit.    Subjective Assessment - 10/12/20 1312     Subjective 1) urinary leakage:   Pt noticed when she was in middle school, she remembered she laughed so hard, she had leakage. Once she had children, she noted leakage. Pt underwent surgery in her 60years of age (Hysterectomy,  Bladder suspension, Rectal reconstruction sugery 1994 after her 2nd child).   Pt does not have prolapse Sx. Pt is not wear pads .  Pt is able to make it to the bathroom without leakage. Nocturia: 2-3 x /night.     2) Fecal leakage started after her 2nd bladder suspension in 2019. It happens once a month. The amount of leakage is the size of a pea.    3) Neck pain / neck ache occurs when sleeping on her back when using a particular pillow. Denied radiating pain down arms. Pain goes away with Motrin. This pain started 10+ years.    4) pelvic pain with gynecological exams with speculum and sexual intercourse: 8/10.  Pt had perineal tears with stitches after 1st child which caused lots of pain with intercourse. Pain got better after 1st bladdeer surgery. Pain returned after second surgery. It is getting better after teh surgery in 2019 but it is still painful.      Pertinent History Hx of Breast Cancer Dx 12 years ago with radiation and lumpectomy. Hysterectomy,  Bladder suspension, Rectal reconstruction sugery 1994. Second bladder suspension surgery 2019 without Pelvic PT. Pt had excessive pushing / vaccum assisted delivery with 1 st child and excessive pushing with 2nd child. 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  Pt takes tai kwon do  which she started 1 year ago.  Pt did weight lifting before COVID.     Patient Stated Goals control bladder and fecal leaks.                [facility] PT Assessment - 10/12/20 1347       Assessment   Referring Provider (PT) Parks Ranger MD      Precautions   Precautions None      Restrictions   Weight Bearing Restrictions No      Balance Screen   Has the patient fallen in the past 6 months No      Observation/Other Assessments   Observations forward head,      Strength   Overall Strength Comments RLE 4-/5, L 5/5 , B hip abd 3/5,      Palpation   SI assessment  L iliac crest higher standing, in supine: levelled ASIS/ malleoli      Ambulation/Gait   Gait Comments limited armswings, R trunk lean,                        Objective measurements completed on examination: See above findings.     Pelvic Floor Special Questions - 10/12/20 1354     Diastasis Recti neg    External Perineal Exam through cloting: overuse of gluts, dyscoordination of pelvic floor withn cued for contraction              [facility] Adult PT Treatment/Exercise - 10/12/20 1347       Therapeutic Activites    Other Therapeutic Activities explained role of IAP system nd antaomy/ physiology images of deep core , body mechanics      Neuro Re-ed    Neuro Re-ed Details  cued for clam shells, modification to L figure-4 stretch due to tightness                         PT Long Term Goals - 10/12/20 1728       PT LONG TERM GOAL #1   Title Pt will demo co-ordination of deep core with against gravity activities ( sit-to -stand, walking, bending) in order to minimize back pain and pelvic floor dysfunction    Time 4    Period Weeks    Status New    Target Date 11/09/20      PT LONG TERM GOAL #2   Title Pt demo no pelvic malalignment across 2 visits in order to progress to deep core exercises      "    Time 2    Period Weeks    Status New    Target Date 10/26/20      PT LONG TERM GOAL #3   Title Pt will demo proper  deep core coordination  with proper diaphragmatic excursion in order to minimize pelvic pain and tolerate pelvic exams and sexual intercourse    Time 6    Period Weeks    Status New    Target Date 11/23/20      PT LONG TERM GOAL #4   Title Pt will demo IND with pelvic floor exercises in supine and seated positions to minimize risk for urinary incontinence    Time 10    Period Weeks    Status New    Target Date 12/21/20      PT LONG TERM GOAL #5   Title Pt will demo no more forward head posture todecrease neck pain and be able to sleep on her back with proper pillows    Time 10    Period Weeks    Status New      Additional Long Term Goals   Additional Long Term Goals Yes      PT LONG TERM GOAL #6   Title Pt wil have no fecal leakage across one month in order to improve QOL    Time 10    Period Weeks    Status New    Target Date 12/21/20                    Plan - 10/12/20 1335     Clinical Impression Statement  Pt is a  60  yo  who presents with urinary and fecal leakage, neck pain, and pelvic pain which impacts her QOL and ADLs.   Pt's musculoskeletal assessment revealed higher L iliac crest, forward head posture, dyscoordination and strength of pelvic floor mm, weak hip weakness, and poor body mechanics which places strain on the abdominal/pelvic floor mm.   These are deficits that indicate an ineffective intraabdominal pressure system associated with increased risk for pt's Sx.   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  Pt will benefit from proper coordination training and education on fitness and functional positions in order to yield greater outcomes.    Additional factors impacting her Sx include: Hx of Breast Cancer Dx 12 years ago with radiation and lumpectomy. Hysterectomy,  Bladder suspension, Rectal reconstruction sugery 1994. Second bladder suspension surgery 2019  without Pelvic PT. Pt had excessive pushing / vaccum assisted delivery with 1 st child and excessive pushing with 2nd child.  Pt takes tai kwon do classes which she started 1 year ago.   Pt was provided education on etiology of Sx with anatomy, physiology explanation with images along with the benefits of customized pelvic PT Tx based on pt's medical conditions and musculoskeletal deficits.  Explained the physiology of deep core mm coordination and roles of pelvic floor function in urination, defecation, sexual function, and postural control with deep core mm system.   Initiated sit to stand body mechanics training and hip abduction strengthening. Pt demo'd IND with cues. Plan to continue assessment leg length at next session and to achieve equal pelvic girdle alignment to implement deep core training. PLan to assess pelvic floor at upcoming sessions.      Examination-Activity Limitations Continence;Toileting    Stability/Clinical Decision Making Evolving/Moderate complexity    Clinical Decision Making Moderate    Rehab Potential Good    PT Frequency 1x / week    PT Duration Other (comment)   10   PT Treatment/Interventions Functional mobility training;Moist Heat;Therapeutic activities;Therapeutic exercise;Cryotherapy;Stair training;Gait  training;Neuromuscular re-education;Patient/family education;Manual techniques;Taping;Splinting;Biofeedback;Balance training;Scar mobilization;Dry needling;Energy conservation;Joint Manipulations    Consulted and Agree with Plan of Care Patient             Patient will benefit from skilled therapeutic intervention in order to improve the following deficits and impairments:  Decreased activity tolerance, Decreased endurance, Decreased range of motion, Decreased coordination, Difficulty walking, Decreased balance, Decreased mobility, Decreased scar mobility, Postural dysfunction, Improper body mechanics, Pain, Increased fascial restricitons, Decreased strength,  Increased muscle spasms, Increased edema, Hypomobility  Visit Diagnosis: Other abnormalities of gait and mobility  Sacrococcygeal disorders, not elsewhere classified     Problem List Patient Active Problem List   Diagnosis Date Noted   Family history of colonic polyps    Cystocele with prolapse 01/22/2018   History of melanoma excision 12/05/2017   Bladder prolapse, female, acquired 11/09/2017   Primary osteoarthritis involving multiple joints 11/09/2017   Chronic left hip pain 11/09/2017   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 SERVICES [address] [place], [place], [address] Phone: [phone]   Fax:  [phone]  Name: Dashanique Brownstein MRN: [id number] Date of Birth: 25-Feb-1961

## 2020-10-19 ENCOUNTER — Ambulatory Visit: Payer: 59 | Admitting: Physical Therapy

## 2020-10-19 ENCOUNTER — Ambulatory Visit (INDEPENDENT_AMBULATORY_CARE_PROVIDER_SITE_OTHER): Payer: 59 | Admitting: Family Medicine

## 2020-10-19 ENCOUNTER — Encounter: Payer: Self-pay | Admitting: Family Medicine

## 2020-10-19 ENCOUNTER — Other Ambulatory Visit: Payer: Self-pay

## 2020-10-19 VITALS — BP 124/69 | HR 60 | Ht 68.0 in | Wt 166.4 lb

## 2020-10-19 DIAGNOSIS — R2689 Other abnormalities of gait and mobility: Secondary | ICD-10-CM | POA: Diagnosis not present

## 2020-10-19 DIAGNOSIS — M533 Sacrococcygeal disorders, not elsewhere classified: Secondary | ICD-10-CM

## 2020-10-19 DIAGNOSIS — R490 Dysphonia: Secondary | ICD-10-CM | POA: Diagnosis not present

## 2020-10-19 NOTE — Patient Instructions (Addendum)
Thank you for coming to the office today.  [address]  [place], [place] [address] Ph: [phone]  Referral sent today, stay tuned for apt.  Most likely polyp or other similar problem. Could be nerve or vocal cord paralysis related but that is rare  Please schedule a Follow-up Appointment to: Return if symptoms worsen or fail to improve.  If you have any other questions or concerns, please feel free to call the office or send a message through [place]. You may also schedule an earlier appointment if necessary.  Additionally, you may be receiving a survey about your experience at our office within a few days to 1 week by e-mail or mail. We value your feedback.  Nobie Putnam, DO [place]

## 2020-10-19 NOTE — Patient Instructions (Signed)
After clamshells   1)Open book ( handout)     On your back  2) Sushi rolled Towel under neck-  6 directions of the neck  5 reps each     3) Lying on back, knees bent    band under ballmounds  while laying on back w/ knees bent  "W" exercise  10 reps x 2 sets   Band is placed under feet, knees bent, feet are hip width apart Hold band with thumbs point out, keep upper arm and elbow touching the bed the whole time  - inhale and then exhale pull bands by bending elbows hands move in a "w"  (feel shoulder blades squeezing)

## 2020-10-19 NOTE — Therapy (Signed)
[place] MAIN [facility] SERVICES [address] [place], [place], [address] Phone: [phone]   Fax:  [phone]  Physical Therapy Treatment  Patient Details  Name: Alyssa Bradford MRN: [id number] Date of Birth: 10-Jun-1960 Referring Provider (PT): Parks Ranger MD   Encounter Date: 10/19/2020   PT End of Session - 10/19/20 1549     Visit Number 2    Number of Visits 10    Date for PT Re-Evaluation 12/21/20    PT Start Time 1300    PT Stop Time 1400    PT Time Calculation (min) 60 min    Activity Tolerance Patient tolerated treatment well             Past Medical History:  Diagnosis Date   Arthritis    hips and knees   Breast cancer ([place]) 2011   right breast with lumpectomy and rad tx in [place]   Breast cancer, stage 0, 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 Tamoxifen x 3 years, then exemestane. [facility] [place]    [place]    Motion sickness    Nephrolithiasis    Personal history of radiation therapy    Wears contact lenses     Past Surgical History:  Procedure Laterality Date   ABDOMINAL HYSTERECTOMY  1994   partial - still has cervix, ovaries   BLADDER SUSPENSION     approximately 25 yrs ago   BREAST BIOPSY Left 2001   benign   BREAST EXCISIONAL BIOPSY Right 2011   + with rad tx    BREAST LUMPECTOMY  2011   BREAST SURGERY  2001   BIOPSY   COLONOSCOPY WITH PROPOFOL N/A 07/21/2015   Procedure: COLONOSCOPY WITH PROPOFOL;  Surgeon: SChristene Lye MD;  Location: [facility] ENDOSCOPY;  Service: Endoscopy;  Laterality: N/A;   COLONOSCOPY WITH PROPOFOL N/A 08/25/2019   Procedure: COLONOSCOPY WITH PROPOFOL;  Surgeon: WLucilla Lame MD;  [place]  Service: Endoscopy;  Laterality: N/A;   CYSTOCELE REPAIR N/A 01/22/2018   Procedure: ANTERIOR REPAIR (CYSTOCELE);  Surgeon: MBjorn Loser MD;  [facility];  Service:  Urology;  Laterality: N/A;   CYSTOSCOPY N/A 01/22/2018   Procedure: CYSTOSCOPY;  Surgeon: MBjorn Loser MD;  [facility];  Service: Urology;  Laterality: N/A;   NM PET DX MELANOMA     TONSILLECTOMY      There were no vitals filed for this visit.   Subjective Assessment - 10/19/20 1306     Subjective Pt reported she is not sitting alot and she forgets to use the technique. Pt has been doing clamshells    Pertinent History Hx of Breast Cancer Dx 12 years ago with radiation and lumpectomy. Hysterectomy,  Bladder suspension, Rectal reconstruction sugery 1994. Second bladder suspension surgery 2019 without Pelvic PT. Pt had excessive pushing / vaccum assisted delivery with 1 st child and excessive pushing with 2nd child. 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  Pt takes tai kwon do  which she started 1 year ago.  Pt did weight lifting before COVID.    Patient Stated Goals control bladder and fecal leaks.                [facility] - 10[facility]       Observation/Other Assessments   Observations pre Tx: rounded shoudlers, post Tx: more  stacked posture      Coordination   Coordination and Movement Description incorrect clam shell  , requried cues      Palpation   Spinal mobility hypomobility at thoracic/ cervical segments                           [facility] Adult PT Treatment/Exercise - 10/19/20 1549       Neuro Re-ed    Neuro Re-ed Details  cued for HEP and cervical retraction/ scapular retraction/ depression      Manual Therapy   Manual therapy comments STM/MWM at thoracic and cervical spine to promote elongation and mobility                         PT Long Term Goals - 10/12/20 1728       PT LONG TERM GOAL #1   Title Pt will demo co-ordination of deep core with against gravity activities ( sit-to -stand, walking, bending) in order to minimize back pain  and pelvic floor dysfunction    Time 4    Period Weeks    Status New    Target Date 11/09/20      PT LONG TERM GOAL #2   Title Pt demo no pelvic malalignment across 2 visits in order to progress to deep core exercises      "    Time 2    Period Weeks    Status New    Target Date 10/26/20      PT LONG TERM GOAL #3   Title Pt will demo proper deep core coordination with proper diaphragmatic excursion in order to minimize pelvic pain and tolerate pelvic exams and sexual intercourse    Time 6    Period Weeks    Status New    Target Date 11/23/20      PT LONG TERM GOAL #4   Title Pt will demo IND with pelvic floor exercises in supine and seated positions to minimize risk for urinary incontinence    Time 10    Period Weeks    Status New    Target Date 12/21/20      PT LONG TERM GOAL #5   Title Pt will demo no more forward head posture todecrease neck pain and be able to sleep on her back with proper pillows    Time 10    Period Weeks    Status New      Additional Long Term Goals   Additional Long Term Goals Yes      PT LONG TERM GOAL #6   Title Pt wil have no fecal leakage across one month in order to improve QOL    Time 10    Period Weeks    Status New    Target Date 12/21/20                   Plan - 10/19/20 1552     Clinical Impression Statement Pt progressed to manual Tx today to address rounded shoulders, thoracic/ cervical hypomobility. Pt demo'd more erect posture post Tx. Advanced with hooklying resistance band for scapulocervical strengthening to maintain erect postur which will help with increaseing cervical ROM and minimizing neck pain.   Cued for proper clamshell as pt demo'd incorrect form. Pt continues to benefit from skilled PT.    Examination-Activity Limitations Continence;Toileting    Stability/Clinical Decision Making Evolving/Moderate complexity    Clinical Decision Making  Moderate    Rehab Potential Good    PT Frequency 1x / week    PT  Duration Other (comment)   10   PT Treatment/Interventions Functional mobility training;Moist Heat;Therapeutic activities;Therapeutic exercise;Cryotherapy;Stair training;Gait training;Neuromuscular re-education;Patient/family education;Manual techniques;Taping;Splinting;Biofeedback;Balance training;Scar mobilization;Dry needling;Energy conservation;Joint Manipulations    Consulted and Agree with Plan of Care Patient             Patient will benefit from skilled therapeutic intervention in order to improve the following deficits and impairments:  Decreased activity tolerance, Decreased endurance, Decreased range of motion, Decreased coordination, Difficulty walking, Decreased balance, Decreased mobility, Decreased scar mobility, Postural dysfunction, Improper body mechanics, Pain, Increased fascial restricitons, Decreased strength, Increased muscle spasms, Increased edema, Hypomobility  Visit Diagnosis: Other abnormalities of gait and mobility  Sacrococcygeal disorders, not elsewhere classified     Problem List Patient Active Problem List   Diagnosis Date Noted   Family history of colonic polyps    Cystocele with prolapse 01/22/2018   History of melanoma excision 12/05/2017   Bladder prolapse, female, acquired 11/09/2017   Primary osteoarthritis involving multiple joints 11/09/2017   Chronic left hip pain 11/09/2017   History of ductal carcinoma in situ (DCIS) of breast 07/18/2017   Palpitation 02/22/2015    Jerl Mina ,PT, DPT, E-RYT  10/19/2020, 3:56 PM  [place] MAIN [facility] SERVICES [address] [place], [place], [address] Phone: [phone]   Fax:  [phone]  Name: Ithzel Fedorchak MRN: [id number] Date of Birth: Aug 14, 1960

## 2020-10-19 NOTE — Progress Notes (Signed)
Subjective:    Patient ID: Alyssa Bradford, female    DOB: 01-09-1961, 60 y.o.   MRN: [id number]  Alyssa Bradford is a 60 y.o. female presenting on 10/19/2020 for Hoarse   HPI  Hoarse Voice Difficulty problem within the past >1 month, with hoarse voice. No known triggers with singing or using loud voice that may have provoked. She had symptoms onset prior to but the only thing she can think of that contributed could have been stripping furniture project with chemicals may have contributed, but has not been exposed since and it has not resolved. She says no volume, difficulty singing. Her problem is with volume. She says her throat is not sore or painful. She has no drainage from sinuses. No sick symptoms, no cough or congestion. No history of COVID or other respiratory illness. Denies any cough, dyspnea No problem with swallowing, choking - eating drinking / liquids. She tried OTC Decongestant without relief Never smoker. No smoke in household. She has remote history of 2nd hand smoke as child from her parents.  She has done Pelvic Floor PT for past 2 weeks. She is working on spine alignment and deep breathing.  She has no problem with breathing or respiratory.   Depression screen [facility] 2/9 10/19/2020 03/17/2020 12/11/2018  Decreased Interest 0 0 0  Down, Depressed, Hopeless 0 0 0  PHQ - 2 Score 0 0 0  Altered sleeping 1 - -  Tired, decreased energy 0 - -  Change in appetite 0 - -  Feeling bad or failure about yourself  0 - -  Trouble concentrating 0 - -  Moving slowly or fidgety/restless 0 - -  Suicidal thoughts 0 - -  PHQ-9 Score 1 - -  Difficult doing work/chores Not difficult at all - -    Social History   Tobacco Use   Smoking status: Never   Smokeless tobacco: Never  Vaping Use   Vaping Use: Never used  Substance Use Topics   Alcohol use: Yes    Comment: occasional   Drug use: No    Review of Systems Per HPI unless specifically indicated above     Objective:    BP  124/69   Pulse 60   Ht 5\' 8"  (1.727 m)   Wt 166 lb 6.4 oz (75.5 kg)   SpO2 100%   BMI 25.30 kg/m   Wt Readings from Last 3 Encounters:  10/19/20 166 lb 6.4 oz (75.5 kg)  04/01/20 166 lb 3.2 oz (75.4 kg)  03/17/20 166 lb (75.3 kg)    Physical Exam Vitals and nursing note reviewed.  Constitutional:      General: She is not in acute distress.    Appearance: Normal appearance. She is well-developed. She is not diaphoretic.     Comments: Well-appearing, comfortable, cooperative  HENT:     Head: Normocephalic and atraumatic.     Mouth/Throat:     Mouth: Mucous membranes are moist.     Pharynx: No oropharyngeal exudate or posterior oropharyngeal erythema.     Comments: Larger uvula, no tonsils Eyes:     General:        Right eye: No discharge.        Left eye: No discharge.     Conjunctiva/sclera: Conjunctivae normal.  Neck:     Thyroid: No thyromegaly.  Cardiovascular:     Rate and Rhythm: Normal rate and regular rhythm.     Heart sounds: Normal heart sounds. No murmur heard. Pulmonary:  Effort: Pulmonary effort is normal. No respiratory distress.     Breath sounds: Normal breath sounds. No wheezing or rales.  Musculoskeletal:        General: Normal range of motion.     Cervical back: Normal range of motion and neck supple. No tenderness.  Lymphadenopathy:     Cervical: No cervical adenopathy.  Skin:    General: Skin is warm and dry.     Findings: No erythema or rash.  Neurological:     Mental Status: She is alert and oriented to person, place, and time.  Psychiatric:        Mood and Affect: Mood normal.        Behavior: Behavior normal.        Thought Content: Thought content normal.     Comments: Well groomed, good eye contact, normal speech and thoughts     Results for orders placed or performed in visit on 03/12/20  Hemoglobin A1c  Result Value Ref Range   Hgb A1c MFr Bld 5.3 <5.7 % of total Hgb   Mean Plasma Glucose 105 (calc)   eAG (mmol/L) 5.8 (calc)   CBC with Differential/Platelet  Result Value Ref Range   WBC 4.6 3.8 - 10.8 Thousand/uL   RBC 4.36 3.80 - 5.10 Million/uL   Hemoglobin 13.7 11.7 - 15.5 g/dL   HCT 40.4 35.0 - 45.0 %   MCV 92.7 80.0 - 100.0 fL   MCH 31.4 27.0 - 33.0 pg   MCHC 33.9 32.0 - 36.0 g/dL   RDW 12.1 11.0 - 15.0 %   Platelets 220 140 - 400 Thousand/uL   MPV 10.8 7.5 - 12.5 fL   Neutro Abs 2,778 1,500 - 7,800 cells/uL   Lymphs Abs 1,233 850 - 3,900 cells/uL   Absolute Monocytes 391 200 - 950 cells/uL   Eosinophils Absolute 129 15 - 500 cells/uL   Basophils Absolute 69 0 - 200 cells/uL   Neutrophils Relative % 60.4 %   Total Lymphocyte 26.8 %   Monocytes Relative 8.5 %   Eosinophils Relative 2.8 %   Basophils Relative 1.5 %  COMPLETE METABOLIC PANEL WITH GFR  Result Value Ref Range   Glucose, Bld 77 65 - 99 mg/dL   BUN 16 7 - 25 mg/dL   Creat 0.74 0.50 - 0.99 mg/dL   GFR, Est Non African American 88 > OR = 60 mL/min/1.39m2   GFR, Est African American 102 > OR = 60 mL/min/1.49m2   BUN/Creatinine Ratio NOT APPLICABLE 6 - 22 (calc)   Sodium 141 135 - 146 mmol/L   Potassium 4.1 3.5 - 5.3 mmol/L   Chloride 103 98 - 110 mmol/L   CO2 30 20 - 32 mmol/L   Calcium 9.4 8.6 - 10.4 mg/dL   Total Protein 6.9 6.1 - 8.1 g/dL   Albumin 4.5 3.6 - 5.1 g/dL   Globulin 2.4 1.9 - 3.7 g/dL (calc)   AG Ratio 1.9 1.0 - 2.5 (calc)   Total Bilirubin 0.6 0.2 - 1.2 mg/dL   Alkaline phosphatase (APISO) 61 37 - 153 U/L   AST 24 10 - 35 U/L   ALT 9 6 - 29 U/L  Lipid panel  Result Value Ref Range   Cholesterol 193 <200 mg/dL   HDL 68 > OR = 50 mg/dL   Triglycerides 89 <150 mg/dL   LDL Cholesterol (Calc) 107 (H) mg/dL (calc)   Total CHOL/HDL Ratio 2.8 <5.0 (calc)   Non-HDL Cholesterol (Calc) 125 <130 mg/dL (calc)  TSH  Result Value Ref Range   TSH 2.88 0.40 - 4.50 mIU/L      Assessment & Plan:   Problem List Items Addressed This Visit   None Visit Diagnoses     Hoarseness of voice    -  Primary   Relevant Orders    Ambulatory referral to ENT       Hoarse voice >1 month without pain, illness, or other trigger or factors that are known. No voice strain or trigger. She is a non smoker, no 2nd hand smoke. Differential dx includes vocal cord polyp or paralysis or neurological cause.  She has fam history of parkinsons but she has no other symptoms.  Requesting ENT evaluation for further definitive diagnosis and management, will likely need direct laryngoscopy visualization  Orders Placed This Encounter  Procedures   Ambulatory referral to ENT    Referral Priority:   Routine    Referral Type:   Consultation    Referral Reason:   Specialty Services Required    Requested Specialty:   Otolaryngology    Number of Visits Requested:   1     Orders Placed This Encounter  Procedures   Ambulatory referral to ENT    Referral Priority:   Routine    Referral Type:   Consultation    Referral Reason:   Specialty Services Required    Requested Specialty:   Otolaryngology    Number of Visits Requested:   1     No orders of the defined types were placed in this encounter.    Follow up plan: Return if symptoms worsen or fail to improve.  Nobie Putnam, [facility] 10/19/2020, 3:21 PM

## 2020-10-26 ENCOUNTER — Encounter: Payer: 59 | Admitting: Physical Therapy

## 2020-11-02 ENCOUNTER — Other Ambulatory Visit: Payer: Self-pay

## 2020-11-02 ENCOUNTER — Ambulatory Visit: Payer: 59 | Attending: Family Medicine | Admitting: Physical Therapy

## 2020-11-02 DIAGNOSIS — R2689 Other abnormalities of gait and mobility: Secondary | ICD-10-CM

## 2020-11-02 DIAGNOSIS — M533 Sacrococcygeal disorders, not elsewhere classified: Secondary | ICD-10-CM | POA: Insufficient documentation

## 2020-11-02 NOTE — Patient Instructions (Signed)
Legs propped up on pillows 10-15 min after being on your feet   __  When doing 6 directions of neck, come to midline with tactile cue of finger at chin and sternum   ___  Deep core level 1 -2

## 2020-11-02 NOTE — Therapy (Signed)
[place] MAIN [facility] SERVICES [address] [place], [place], [address] Phone: [phone]   Fax:  [phone]  Physical Therapy Treatment  Patient Details  Name: Alyssa Bradford MRN: [id number] Date of Birth: Oct 03, 1960 Referring Provider (PT): Parks Ranger MD   Encounter Date: 11/02/2020   PT End of Session - 11/02/20 1407     Visit Number 3    Number of Visits 10    Date for PT Re-Evaluation 12/21/20    PT Start Time 5625    PT Stop Time 1500    PT Time Calculation (min) 55 min    Activity Tolerance Patient tolerated treatment well             Past Medical History:  Diagnosis Date   Arthritis    hips and knees   Breast cancer ([place]) 2011   right breast with lumpectomy and rad tx in [place]   Breast cancer, stage 0, 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 Tamoxifen x 3 years, [facility] [place]    [place]    Motion sickness    Nephrolithiasis    Personal history of radiation therapy    Wears contact lenses     Past Surgical History:  Procedure Laterality Date   ABDOMINAL HYSTERECTOMY  1994   partial - still has cervix, ovaries   BLADDER SUSPENSION     approximately 25 yrs ago   BREAST BIOPSY Left 2001   benign   BREAST EXCISIONAL BIOPSY Right 2011   + with rad tx    BREAST LUMPECTOMY  2011   BREAST SURGERY  2001   BIOPSY   COLONOSCOPY WITH PROPOFOL N/A 07/21/2015   Procedure: COLONOSCOPY WITH PROPOFOL;  Surgeon: SChristene Lye MD;  Location: [facility];  Service: Endoscopy;  Laterality: N/A;   COLONOSCOPY WITH PROPOFOL N/A 08/25/2019   Procedure: COLONOSCOPY WITH PROPOFOL;  Surgeon: WLucilla Lame MD;  [place]  Service: Endoscopy;  Laterality: N/A;   CYSTOCELE REPAIR N/A 01/22/2018   Procedure: ANTERIOR REPAIR (CYSTOCELE);  Surgeon: MBjorn Loser MD;  [facility];  Service:  Urology;  Laterality: N/A;   CYSTOSCOPY N/A 01/22/2018   Procedure: CYSTOSCOPY;  Surgeon: MBjorn Loser MD;  [facility];  Service: Urology;  Laterality: N/A;   NM PET DX MELANOMA     TONSILLECTOMY      There were no vitals filed for this visit.   Subjective Assessment - 11/02/20 1408     Subjective Pt hs improved leakage and sleeping longer. Pt has been practicing her exercises while on vacation. Pt is trying to remember to get up the proper way out of bed    Pertinent History Hx of Breast Cancer Dx 12 years ago with radiation and lumpectomy. Hysterectomy,  Bladder suspension, Rectal reconstruction sugery 1994. Second bladder suspension surgery 2019 without Pelvic PT. Pt had excessive pushing / vaccum assisted delivery with 1 st child and excessive pushing with 2nd child. 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  Pt takes tai kwon do  which she started 1 year ago.  Pt did weight lifting before COVID.    Patient Stated Goals control bladder and fecal leaks.                [facility] - 11/02/20 1446       Coordination  Coordination and Movement Description poor alignment of cervial spine at midline, slight torticullis L      Palpation   Spinal mobility hypomobile lower cervical segments, tightenss at SCM/uper trap,                           [facility] Adult PT Treatment/Exercise - 11/02/20 1448       Therapeutic Activites    Other Therapeutic Activities cued for relaxation  with legs propped up, explained relaxation for urinary system and blood circulation in this restorative yoga pose      Neuro Re-ed    Neuro Re-ed Details  cued for midline of cervical spine, deep core coordination 1-2      Modalities   Modalities Moist Heat      Moist Heat Therapy   Number Minutes Moist Heat 5 Minutes    Moist Heat Location --   cervical, weighted pressure on shoulders for passive  stetching of pect, legs propped up for traction of spine, blood flow, relaxation     Manual Therapy   Manual therapy comments distraction at occiput, STM/MWM at cervical mm noted in assessment                         PT Long Term Goals - 10/12/20 1728       PT LONG TERM GOAL #1   Title Pt will demo co-ordination of deep core with against gravity activities ( sit-to -stand, walking, bending) in order to minimize back pain and pelvic floor dysfunction    Time 4    Period Weeks    Status New    Target Date 11/09/20      PT LONG TERM GOAL #2   Title Pt demo no pelvic malalignment across 2 visits in order to progress to deep core exercises      "    Time 2    Period Weeks    Status New    Target Date 10/26/20      PT LONG TERM GOAL #3   Title Pt will demo proper deep core coordination with proper diaphragmatic excursion in order to minimize pelvic pain and tolerate pelvic exams and sexual intercourse    Time 6    Period Weeks    Status New    Target Date 11/23/20      PT LONG TERM GOAL #4   Title Pt will demo IND with pelvic floor exercises in supine and seated positions to minimize risk for urinary incontinence    Time 10    Period Weeks    Status New    Target Date 12/21/20      PT LONG TERM GOAL #5   Title Pt will demo no more forward head posture todecrease neck pain and be able to sleep on her back with proper pillows    Time 10    Period Weeks    Status New      Additional Long Term Goals   Additional Long Term Goals Yes      PT LONG TERM GOAL #6   Title Pt wil have no fecal leakage across one month in order to improve QOL    Time 10    Period Weeks    Status New    Target Date 12/21/20                   Plan - 11/02/20 1408  Clinical Impression Statement Pt is making progress with less leakage. Pt today showed good improvement with less forward head posture but still required manual Tx to minimize hypomobility at lower cervical  spine. Pt had no complaints with Tx. Propioception training included correcting position of neck at midline for slight L torticollis position.  Initiated deep core coordination and required cues for proper technique. Pt demo'd IND with HEP.    Examination-Activity Limitations Continence;Toileting    Stability/Clinical Decision Making Evolving/Moderate complexity    Rehab Potential Good    PT Frequency 1x / week    PT Duration Other (comment)   10   PT Treatment/Interventions Functional mobility training;Moist Heat;Therapeutic activities;Therapeutic exercise;Cryotherapy;Stair training;Gait training;Neuromuscular re-education;Patient/family education;Manual techniques;Taping;Splinting;Biofeedback;Balance training;Scar mobilization;Dry needling;Energy conservation;Joint Manipulations    Consulted and Agree with Plan of Care Patient             Patient will benefit from skilled therapeutic intervention in order to improve the following deficits and impairments:  Decreased activity tolerance, Decreased endurance, Decreased range of motion, Decreased coordination, Difficulty walking, Decreased balance, Decreased mobility, Decreased scar mobility, Postural dysfunction, Improper body mechanics, Pain, Increased fascial restricitons, Decreased strength, Increased muscle spasms, Increased edema, Hypomobility  Visit Diagnosis: Sacrococcygeal disorders, not elsewhere classified  Other abnormalities of gait and mobility     Problem List Patient Active Problem List   Diagnosis Date Noted   Family history of colonic polyps    Cystocele with prolapse 01/22/2018   History of melanoma excision 12/05/2017   Bladder prolapse, female, acquired 11/09/2017   Primary osteoarthritis involving multiple joints 11/09/2017   Chronic left hip pain 11/09/2017   History of ductal carcinoma in situ (DCIS) of breast 07/18/2017   Palpitation 02/22/2015    Jerl Mina ,PT, DPT, E-RYT  11/02/2020, 3:01  PM  [place] MAIN [facility] SERVICES [address] [place], [place], [address] Phone: [phone]   Fax:  [phone]  Name: Dewana Ammirati MRN: [id number] Date of Birth: 1961-01-21

## 2020-11-09 ENCOUNTER — Other Ambulatory Visit: Payer: Self-pay

## 2020-11-09 ENCOUNTER — Ambulatory Visit: Payer: 59 | Admitting: Physical Therapy

## 2020-11-09 DIAGNOSIS — M533 Sacrococcygeal disorders, not elsewhere classified: Secondary | ICD-10-CM

## 2020-11-09 NOTE — Therapy (Signed)
[place] MAIN [facility] SERVICES [address] [place], [place], [address] Phone: [phone]   Fax:  [phone]  Physical Therapy Treatment  Patient Details  Name: Alyssa Bradford MRN: [id number] Date of Birth: Aug 17, 1960 Referring Provider (PT): Parks Ranger MD   Encounter Date: 11/09/2020   PT End of Session - 11/09/20 1406     Visit Number 4    Number of Visits 10    Date for PT Re-Evaluation 12/21/20    PT Start Time 1305    PT Stop Time 1405    PT Time Calculation (min) 60 min    Activity Tolerance Patient tolerated treatment well             Past Medical History:  Diagnosis Date   Arthritis    hips and knees   Breast cancer ([place]) 2011   right breast with lumpectomy and rad tx in [place]   Breast cancer, stage 0, 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 Tamoxifen x 3 years, [facility] [place]    [place]    Motion sickness    Nephrolithiasis    Personal history of radiation therapy    Wears contact lenses     Past Surgical History:  Procedure Laterality Date   ABDOMINAL HYSTERECTOMY  1994   partial - still has cervix, ovaries   BLADDER SUSPENSION     approximately 25 yrs ago   BREAST BIOPSY Left 2001   benign   BREAST EXCISIONAL BIOPSY Right 2011   + with rad tx    BREAST LUMPECTOMY  2011   BREAST SURGERY  2001   BIOPSY   COLONOSCOPY WITH PROPOFOL N/A 07/21/2015   Procedure: COLONOSCOPY WITH PROPOFOL;  Surgeon: SChristene Lye MD;  Location: [facility];  Service: Endoscopy;  Laterality: N/A;   COLONOSCOPY WITH PROPOFOL N/A 08/25/2019   Procedure: COLONOSCOPY WITH PROPOFOL;  Surgeon: WLucilla Lame MD;  [place]  Service: Endoscopy;  Laterality: N/A;   CYSTOCELE REPAIR N/A 01/22/2018   Procedure: ANTERIOR REPAIR (CYSTOCELE);  Surgeon: MBjorn Loser MD;  [facility];  Service:  Urology;  Laterality: N/A;   CYSTOSCOPY N/A 01/22/2018   Procedure: CYSTOSCOPY;  Surgeon: MBjorn Loser MD;  [facility];  Service: Urology;  Laterality: N/A;   NM PET DX MELANOMA     TONSILLECTOMY      There were no vitals filed for this visit.   Subjective Assessment - 11/09/20 1315     Subjective Pt hs noticed close calls to leakage but di dnotn leak which is a sign to her that there is some improvement.  Pt is sleeping better    Pertinent History Hx of Breast Cancer Dx 12 years ago with radiation and lumpectomy. Hysterectomy,  Bladder suspension, Rectal reconstruction sugery 1994. Second bladder suspension surgery 2019 without Pelvic PT. Pt had excessive pushing / vaccum assisted delivery with 1 st child and excessive pushing with 2nd child. 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  Pt takes tai kwon do  which she started 1 year ago.  Pt did weight lifting before COVID.    Patient Stated Goals control bladder and fecal leaks.                [facility] - 11[facility]20 1317       Observation/Other Assessments  Observations no more forward posture, thoracic kyphosis                        Pelvic Floor Special Questions - 11/09/20 1319     Prolapse Anterior Wall   hooklying inside introitus   Pelvic Floor Internal Exam pt consented verbally with no contraindications    Exam Type Vaginal    Palpation perineal scar restriction 3-9 clock 1-2 layers mm   tenderness noted, modified STM              [facility] Adult PT Treatment/Exercise - 11/09/20 1420       Therapeutic Activites    Other Therapeutic Activities car seat modification to maintai upright posture      Neuro Re-ed    Neuro Re-ed Details  cued for pelvic floor stretches, baby handling with lifting, floor to stand when visitng grandson,      Modalities   Modalities Moist Heat      Moist Heat Therapy   Moist Heat  Location --   supported childs pose to lengthen pelvic floor     Manual Therapy   Internal Pelvic Floor STM/MWM at problem areas noted in assessment                         PT Long Term Goals - 10/12/20 1728       PT LONG TERM GOAL #1   Title Pt will demo co-ordination of deep core with against gravity activities ( sit-to -stand, walking, bending) in order to minimize back pain and pelvic floor dysfunction    Time 4    Period Weeks    Status New    Target Date 11/09/20      PT LONG TERM GOAL #2   Title Pt demo no pelvic malalignment across 2 visits in order to progress to deep core exercises      "    Time 2    Period Weeks    Status New    Target Date 10/26/20      PT LONG TERM GOAL #3   Title Pt will demo proper deep core coordination with proper diaphragmatic excursion in order to minimize pelvic pain and tolerate pelvic exams and sexual intercourse    Time 6    Period Weeks    Status New    Target Date 11/23/20      PT LONG TERM GOAL #4   Title Pt will demo IND with pelvic floor exercises in supine and seated positions to minimize risk for urinary incontinence    Time 10    Period Weeks    Status New    Target Date 12/21/20      PT LONG TERM GOAL #5   Title Pt will demo no more forward head posture todecrease neck pain and be able to sleep on her back with proper pillows    Time 10    Period Weeks    Status New      Additional Long Term Goals   Additional Long Term Goals Yes      PT LONG TERM GOAL #6   Title Pt wil have no fecal leakage across one month in order to improve QOL    Time 10    Period Weeks    Status New    Target Date 12/21/20                   Plan -  11/09/20 1412     Clinical Impression Statement Pt has shown significant changes to maintaining a more upright posture which deems her readiness for pelvic floor portion of her rehab as her IAP system is more effective now. Pelvic floor perineal scar restrictions were  present and modifications to fascial release was applied to minimize tenderness. Pt demo'd improved mobility post Tx. Provided training for body mechanics with floor<> stand and baby lifting in preparation for her visit to see grandson. Car seat modifications were provided to minimize posture posture. Pt demo'd techniques correctly after cues. Pt continues to benefit from skilled PT   Examination-Activity Limitations Continence;Toileting    Stability/Clinical Decision Making Evolving/Moderate complexity    Rehab Potential Good    PT Frequency 1x / week    PT Duration Other (comment)   10   PT Treatment/Interventions Functional mobility training;Moist Heat;Therapeutic activities;Therapeutic exercise;Cryotherapy;Stair training;Gait training;Neuromuscular re-education;Patient/family education;Manual techniques;Taping;Splinting;Biofeedback;Balance training;Scar mobilization;Dry needling;Energy conservation;Joint Manipulations    Consulted and Agree with Plan of Care Patient             Patient will benefit from skilled therapeutic intervention in order to improve the following deficits and impairments:  Decreased activity tolerance, Decreased endurance, Decreased range of motion, Decreased coordination, Difficulty walking, Decreased balance, Decreased mobility, Decreased scar mobility, Postural dysfunction, Improper body mechanics, Pain, Increased fascial restricitons, Decreased strength, Increased muscle spasms, Increased edema, Hypomobility  Visit Diagnosis: Sacrococcygeal disorders, not elsewhere classified     Problem List Patient Active Problem List   Diagnosis Date Noted   Family history of colonic polyps    Cystocele with prolapse 01/22/2018   History of melanoma excision 12/05/2017   Bladder prolapse, female, acquired 11/09/2017   Primary osteoarthritis involving multiple joints 11/09/2017   Chronic left hip pain 11/09/2017   History of ductal carcinoma in situ (DCIS) of breast  07/18/2017   Palpitation 02/22/2015    Jerl Mina ,PT, DPT, E-RYT  11/09/2020, 2:34 PM  [place] MAIN [facility] SERVICES [address] [place], [place], [address] Phone: [phone]   Fax:  [phone]  Name: Alyssa Bradford MRN: [id number] Date of Birth: [birth year]

## 2020-11-09 NOTE — Patient Instructions (Addendum)
Stretch for pelvic floor   V- slides  "v heels slide away and then back toward buttocks and then rock knee to slight ,  slide heel along at 11 o clock away from buttocks   10 reps      On belly: Riding horse edge of mattress  knee bent like riding a horse, move knee towards armpit and out  10 reps   Mermaid stretch  Rocking while seated on the floor with heels to one side of the hip Heels to one side of the hip  Rock forward towards the knee that is bent , rock beck towards the opposite sitting bones    childs poses rocking   Toes tucked, shoulders down and back, on forearms , hands shoulder width apart  10 reps    ___  Transition from standing to floor :  stand to floor transfer :      _ slow     _ mini squat      _ crawl down with one hand on thigh      _downward dog  -or  downward dog  or forearms propped on chair /stool  >   shoulders down and back-  walk the dog ( knee bents to lengthe hamstrings)      Floor to stand :   downward dog  or forearms propped on chair /stool  crawl hands back, butt is back, knees behind toes -> squat  Hands at waist , elbows back, chest lifts    --   Car seat modification  Folded towels to fill bucket seat Folded beach towel long ways against back of seat into head rest to fill in the space so your head and back and sacrum aligns  Hands at 9 and 3 o clock  Feet and heel on the mat  Back of the hips against the seat evenly

## 2020-11-16 ENCOUNTER — Encounter: Payer: 59 | Admitting: Physical Therapy

## 2020-11-18 ENCOUNTER — Other Ambulatory Visit: Payer: Self-pay | Admitting: Family Medicine

## 2020-11-18 DIAGNOSIS — Z1231 Encounter for screening mammogram for malignant neoplasm of breast: Secondary | ICD-10-CM

## 2020-11-23 ENCOUNTER — Ambulatory Visit: Payer: 59 | Attending: Family Medicine | Admitting: Physical Therapy

## 2020-11-23 ENCOUNTER — Other Ambulatory Visit: Payer: Self-pay

## 2020-11-23 DIAGNOSIS — R2689 Other abnormalities of gait and mobility: Secondary | ICD-10-CM | POA: Diagnosis present

## 2020-11-23 DIAGNOSIS — M533 Sacrococcygeal disorders, not elsewhere classified: Secondary | ICD-10-CM | POA: Diagnosis not present

## 2020-11-23 NOTE — Therapy (Signed)
[place] MAIN [facility] SERVICES [address] [place], [place], [address] Phone: [phone]   Fax:  [phone]  Physical Therapy Treatment  Patient Details  Name: Alyssa Bradford MRN: [id number] Date of Birth: [birth year] Referring Provider (PT): Parks Ranger MD   Encounter Date: 11/23/2020   PT End of Session - 11/23/20 1310     Visit Number 5    Number of Visits 10    Date for PT Re-Evaluation 12/21/20    PT Start Time 1304    PT Stop Time 1400    PT Time Calculation (min) 56 min    Activity Tolerance Patient tolerated treatment well             Past Medical History:  Diagnosis Date   Arthritis    hips and knees   Breast cancer ([place]) 2011   right breast with lumpectomy and rad tx in [place]   Breast cancer, stage 0, 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 Tamoxifen x 3 years, [facility] [place]    [place] ([place]    Motion sickness    Nephrolithiasis    Personal history of radiation therapy    Wears contact lenses     Past Surgical History:  Procedure Laterality Date   ABDOMINAL HYSTERECTOMY  1994   partial - still has cervix, ovaries   BLADDER SUSPENSION     approximately 25 yrs ago   BREAST BIOPSY Left 2001   benign   BREAST EXCISIONAL BIOPSY Right 2011   + with rad tx    BREAST LUMPECTOMY  2011   BREAST SURGERY  2001   BIOPSY   COLONOSCOPY WITH PROPOFOL N/A 07/21/2015   Procedure: COLONOSCOPY WITH PROPOFOL;  Surgeon: SChristene Lye MD;  Location: [facility];  Service: Endoscopy;  Laterality: N/A;   COLONOSCOPY WITH PROPOFOL N/A 08/25/2019   Procedure: COLONOSCOPY WITH PROPOFOL;  Surgeon: WLucilla Lame MD;  [place]  Service: Endoscopy;  Laterality: N/A;   CYSTOCELE REPAIR N/A 01/22/2018   Procedure: ANTERIOR REPAIR (CYSTOCELE);  Surgeon: MBjorn Loser MD;  [facility];  Service:  Urology;  Laterality: N/A;   CYSTOSCOPY N/A 01/22/2018   Procedure: CYSTOSCOPY;  Surgeon: MBjorn Loser MD;  [facility];  Service: Urology;  Laterality: N/A;   NM PET DX MELANOMA     TONSILLECTOMY      There were no vitals filed for this visit.   Subjective Assessment - 11/23/20 1308     Subjective Pt did not get to her exercises while [place] Pt did [name] two weeks ago and hyperextended her R knee in a kick and it swelled up. She iced it and elevated it. Pt started [name] in April. Pt has been stressed about a family situation and she thinks it contributed to her daily leakage and digestive issues.    Pertinent History Hx of Breast Cancer Dx 12 years ago with radiation and lumpectomy. Hysterectomy,  Bladder suspension, Rectal reconstruction sugery 1994. Second bladder suspension surgery 2019 without Pelvic PT. Pt had excessive pushing / vaccum assisted delivery with 1 st child and excessive pushing with 2nd child. 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  Pt takes tai kwon do  which she started 1 year ago.  Pt did weight  lifting before COVID.    Patient Stated Goals control bladder and fecal leaks.                [facility] PT Assessment - 11/23/20 1334       Strength   Overall Strength Comments L hip abd 3/5 w/ pain ,  R hip 4+/5  ( Post Tx: L 4+/5)      Palpation   SI assessment  hypomobile SIJ in FADDIR on R    Palpation comment swelling in R knee                           [facility] Adult PT Treatment/Exercise - 11/23/20 1400       Therapeutic Activites    Other Therapeutic Activities explained the implications of SLS and kicking in tae kwon do on pelvic stability/ injury risks, provided resources for pt's stress about family      Manual Therapy   Manual therapy comments Grade III mob in FADDIR L to promote mobility at SIJ       [employer]   Edema to promote more circulate, less edema, ER of femur                         PT Long Term Goals - 11/23/20 1434       PT LONG TERM GOAL #1   Title Pt will demo co-ordination of deep core with against gravity activities ( sit-to -stand, walking, bending) in order to minimize back pain and pelvic floor dysfunction    Time 4    Period Weeks    Status On-going      PT LONG TERM GOAL #2   Title Pt demo no pelvic malalignment across 2 visits in order to progress to deep core exercises      "    Time 2    Period Weeks    Status Achieved      PT LONG TERM GOAL #3   Title Pt will demo proper deep core coordination with proper diaphragmatic excursion in order to minimize pelvic pain and tolerate pelvic exams and sexual intercourse    Time 6    Period Weeks    Status On-going      PT LONG TERM GOAL #4   Title Pt will demo IND with pelvic floor exercises in supine and seated positions to minimize risk for urinary incontinence    Time 10    Period Weeks    Status On-going      PT LONG TERM GOAL #5   Title Pt will demo no more forward head posture todecrease neck pain and be able to sleep on her back with proper pillows    Time 10    Period Weeks    Status Achieved      PT LONG TERM GOAL #6   Title Pt wil have no fecal leakage across one month in order to improve QOL    Time 10    Period Weeks    Status On-going                   Plan - 11/23/20 1329     Clinical Impression Statement Pt 's posture is maintained upright but demo'd increased L SIJ hypomobility. Pt demo'd improved SIJ mobility post Tx. Pt incurred a R knee injury which involved swelling while performing a kick in [name] 2 weeks ago. Taping was applied  to R knee to minimize edema. No thorough assessment was performed as pt reported it has improved and it does not impact her walking. Plan to assess next session. Explained the implications of SLS and kicking in [place]  do classes on pelvic stability/ prolapse/ injury risks and advised pt to limit her kicks if she returns to class tonight given her R knee is still swollen. Pt voiced she is considering not returning to [employer] classes. Pt voiced she noticed more leakage lately while she is experiencing stress related to family issues. Therapist provided resources for pt's stress about family and was explained the role of nn system on pelvic and GI function with anatomy/ physiology images. Plan to reassess pelvic floor at next session. Pt continues to benefit from skilled PT   Examination-Activity Limitations Continence;Toileting    Stability/Clinical Decision Making Evolving/Moderate complexity    Rehab Potential Good    PT Frequency 1x / week    PT Duration Other (comment)   10   PT Treatment/Interventions Functional mobility training;Moist Heat;Therapeutic activities;Therapeutic exercise;Cryotherapy;Stair training;Gait training;Neuromuscular re-education;Patient/family education;Manual techniques;Taping;Splinting;Biofeedback;Balance training;Scar mobilization;Dry needling;Energy conservation;Joint Manipulations    Consulted and Agree with Plan of Care Patient             Patient will benefit from skilled therapeutic intervention in order to improve the following deficits and impairments:  Decreased activity tolerance, Decreased endurance, Decreased range of motion, Decreased coordination, Difficulty walking, Decreased balance, Decreased mobility, Decreased scar mobility, Postural dysfunction, Improper body mechanics, Pain, Increased fascial restricitons, Decreased strength, Increased muscle spasms, Increased edema, Hypomobility  Visit Diagnosis: Sacrococcygeal disorders, not elsewhere classified  Other abnormalities of gait and mobility     Problem List Patient Active Problem List   Diagnosis Date Noted   Family history of colonic polyps    Cystocele with prolapse 01/22/2018   History of melanoma  excision 12/05/2017   Bladder prolapse, female, acquired 11/09/2017   Primary osteoarthritis involving multiple joints 11/09/2017   Chronic left hip pain 11/09/2017   History of ductal carcinoma in situ (DCIS) of breast 07/18/2017   Palpitation 02/22/2015    Jerl Mina ,PT, DPT, E-RYT  11/23/2020, 2:35 PM  [place] MAIN [facility] SERVICES [address] [place], [place], [address] Phone: [phone]   Fax:  [phone]  Name: Alyssa Bradford MRN: [id number] Date of Birth: [birth year]

## 2020-11-30 ENCOUNTER — Ambulatory Visit: Payer: 59 | Admitting: Physical Therapy

## 2020-12-07 ENCOUNTER — Ambulatory Visit: Payer: 59 | Admitting: Physical Therapy

## 2020-12-07 ENCOUNTER — Other Ambulatory Visit: Payer: Self-pay

## 2020-12-07 DIAGNOSIS — R2689 Other abnormalities of gait and mobility: Secondary | ICD-10-CM

## 2020-12-07 DIAGNOSIS — M533 Sacrococcygeal disorders, not elsewhere classified: Secondary | ICD-10-CM

## 2020-12-07 NOTE — Therapy (Signed)
[place] [facility] SERVICES [address] [place], [place], [address] Phone: [phone]   Fax:  [phone]  Physical Therapy Treatment  Patient Details  Name: Alyssa Bradford MRN: [id number] Date of Birth: 09/20/1960 Referring Provider (PT): Parks Ranger MD   Encounter Date: 12/07/2020   PT End of Session - 12/07/20 1639     Visit Number 6    Number of Visits 10    Date for PT Re-Evaluation 12/21/20    PT Start Time 1402    PT Stop Time 1500    PT Time Calculation (min) 58 min    Activity Tolerance Patient tolerated treatment well             Past Medical History:  Diagnosis Date   Arthritis    hips and knees   Breast cancer ([place]) 2011   right breast with lumpectomy and rad tx in [place]   Breast cancer, stage 0, 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 Tamoxifen x 3 years, then exemestane. [facility] [place] [place]    [place]    Motion sickness    Nephrolithiasis    Personal history of radiation therapy    Wears contact lenses     Past Surgical History:  Procedure Laterality Date   ABDOMINAL HYSTERECTOMY  1994   partial - still has cervix, ovaries   BLADDER SUSPENSION     approximately 25 yrs ago   BREAST BIOPSY Left 2001   benign   BREAST EXCISIONAL BIOPSY Right 2011   + with rad tx    BREAST LUMPECTOMY  2011   BREAST SURGERY  2001   BIOPSY   COLONOSCOPY WITH PROPOFOL N/A 07/21/2015   Procedure: COLONOSCOPY WITH PROPOFOL;  Surgeon: SChristene Lye MD;  Location: [facility];  Service: Endoscopy;  Laterality: N/A;   COLONOSCOPY WITH PROPOFOL N/A 08/25/2019   Procedure: COLONOSCOPY WITH PROPOFOL;  Surgeon: WLucilla Lame MD;  [place]  Service: Endoscopy;  Laterality: N/A;   CYSTOCELE REPAIR N/A 01/22/2018   Procedure: ANTERIOR REPAIR (CYSTOCELE);  Surgeon: MBjorn Loser MD;  [facility];  Service:  Urology;  Laterality: N/A;   CYSTOSCOPY N/A 01/22/2018   Procedure: CYSTOSCOPY;  Surgeon: MBjorn Loser MD;  [facility];  Service: Urology;  Laterality: N/A;   NM PET DX MELANOMA     TONSILLECTOMY      There were no vitals filed for this visit.   Subjective Assessment - 12/07/20 1409     Subjective Pt did notice urinary leakeage is better when wearing underwear versus no underwear when she is in her PJs at home.    Pertinent History Hx of Breast Cancer Dx 12 years ago with radiation and lumpectomy. Hysterectomy,  Bladder suspension, Rectal reconstruction sugery 1994. Second bladder suspension surgery 2019 without Pelvic PT. Pt had excessive pushing / vaccum assisted delivery with 1 st child and excessive pushing with 2nd child. 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  Pt takes tai kwon do  which she started 1 year ago.  Pt did weight lifting before COVID.    Patient Stated Goals control bladder and fecal leaks.                            Pelvic Floor Special Questions - 12/07/20 1440  Prolapse None    Pelvic Floor Internal Exam pt consented verbally with no contraindications    Exam Type Vaginal    Palpation perineal scar restriction 3-9 clock 3rd  layer mm   less tenderness   Strength weak squeeze, no lift               [facility] Adult PT Treatment/Exercise - 12/07/20 1441       Neuro Re-ed    Neuro Re-ed Details  cued for deep core and anterior tilt of pelvis      Moist Heat Therapy   Number Minutes Moist Heat 5 Minutes    Moist Heat Location Other (comment)   perineum though sheets, guided relaxation     Manual Therapy   Manual therapy comments STM/MWM at problem area noted in assessment                         PT Long Term Goals - 11/23/20 1434       PT LONG TERM GOAL #1   Title Pt will demo co-ordination of deep core with against gravity activities (  sit-to -stand, walking, bending) in order to minimize back pain and pelvic floor dysfunction    Time 4    Period Weeks    Status On-going      PT LONG TERM GOAL #2   Title Pt demo no pelvic malalignment across 2 visits in order to progress to deep core exercises      "    Time 2    Period Weeks    Status Achieved      PT LONG TERM GOAL #3   Title Pt will demo proper deep core coordination with proper diaphragmatic excursion in order to minimize pelvic pain and tolerate pelvic exams and sexual intercourse    Time 6    Period Weeks    Status On-going      PT LONG TERM GOAL #4   Title Pt will demo IND with pelvic floor exercises in supine and seated positions to minimize risk for urinary incontinence    Time 10    Period Weeks    Status On-going      PT LONG TERM GOAL #5   Title Pt will demo no more forward head posture todecrease neck pain and be able to sleep on her back with proper pillows    Time 10    Period Weeks    Status Achieved      PT LONG TERM GOAL #6   Title Pt wil have no fecal leakage across one month in order to improve QOL    Time 10    Period Weeks    Status On-going                   Plan - 12/07/20 1639     Clinical Impression Statement Pt demo'd no more lowered pelvic organ compared to last session which indicates improvement. Pt progressed to deep core coordination after manual Tx to minimize perineal scar restrictions at deeper layers of mm and after cuing for less chest breathing/ abdominal straining. Plan to progress to pelvic floor strengthening in upcoming sessions. Pt continues to benefit from skilled PT.    Examination-Activity Limitations Continence;Toileting    Stability/Clinical Decision Making Evolving/Moderate complexity    Rehab Potential Good    PT Frequency 1x / week    PT Duration Other (comment)   10   PT Treatment/Interventions Functional mobility training;Moist  Heat;Therapeutic activities;Therapeutic  exercise;Cryotherapy;Stair training;Gait training;Neuromuscular re-education;Patient/family education;Manual techniques;Taping;Splinting;Biofeedback;Balance training;Scar mobilization;Dry needling;Energy conservation;Joint Manipulations    Consulted and Agree with Plan of Care Patient             Patient will benefit from skilled therapeutic intervention in order to improve the following deficits and impairments:  Decreased activity tolerance, Decreased endurance, Decreased range of motion, Decreased coordination, Difficulty walking, Decreased balance, Decreased mobility, Decreased scar mobility, Postural dysfunction, Improper body mechanics, Pain, Increased fascial restricitons, Decreased strength, Increased muscle spasms, Increased edema, Hypomobility  Visit Diagnosis: Other abnormalities of gait and mobility  Sacrococcygeal disorders, not elsewhere classified     Problem List Patient Active Problem List   Diagnosis Date Noted   Family history of colonic polyps    Cystocele with prolapse 01/22/2018   History of melanoma excision 12/05/2017   Bladder prolapse, female, acquired 11/09/2017   Primary osteoarthritis involving multiple joints 11/09/2017   Chronic left hip pain 11/09/2017   History of ductal carcinoma in situ (DCIS) of breast 07/18/2017   Palpitation 02/22/2015    Jerl Mina ,PT, DPT, E-RYT  12/07/2020, 4:42 PM  [place] MAIN [facility] SERVICES [address] [place], [place], [address] Phone: [phone]   Fax:  [phone]  Name: Alyssa Bradford MRN: [id number] Date of Birth: 01/18/1961

## 2020-12-09 ENCOUNTER — Other Ambulatory Visit: Payer: Self-pay

## 2020-12-09 ENCOUNTER — Ambulatory Visit
Admission: RE | Admit: 2020-12-09 | Discharge: 2020-12-09 | Disposition: A | Discharge status: Home / Self Care | Payer: 59 | Source: Ambulatory Visit | Attending: Family Medicine | Admitting: Family Medicine

## 2020-12-09 DIAGNOSIS — Z1231 Encounter for screening mammogram for malignant neoplasm of breast: Secondary | ICD-10-CM

## 2020-12-14 ENCOUNTER — Encounter: Payer: 59 | Admitting: Physical Therapy

## 2020-12-21 ENCOUNTER — Other Ambulatory Visit: Payer: Self-pay

## 2020-12-21 ENCOUNTER — Ambulatory Visit: Payer: 59 | Admitting: Physical Therapy

## 2020-12-21 DIAGNOSIS — M533 Sacrococcygeal disorders, not elsewhere classified: Secondary | ICD-10-CM

## 2020-12-21 DIAGNOSIS — R2689 Other abnormalities of gait and mobility: Secondary | ICD-10-CM

## 2020-12-21 NOTE — Therapy (Signed)
[place] [facility] SERVICES [address] [place], [place], [address] Phone: [phone]   Fax:  [phone]  Physical Therapy Treatment  Patient Details  Name: Alyssa Bradford MRN: [id number] Date of Birth: 05-20-1960 Referring Provider (PT): Parks Ranger MD   Encounter Date: 12/21/2020   PT End of Session - 12/21/20 1555     Visit Number 7    Date for PT Re-Evaluation 03/40/35   Recert 2/48   PT Start Time 1504    PT Stop Time 1600    PT Time Calculation (min) 56 min    Activity Tolerance Patient tolerated treatment well             Past Medical History:  Diagnosis Date   Arthritis    hips and knees   Breast cancer ([place]) 2011   right breast with lumpectomy and rad tx in [place]   Breast cancer, stage 0, 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 Tamoxifen x 3 years, then exemestane. [facility] [place]    [place]    Motion sickness    Nephrolithiasis    Personal history of radiation therapy    Wears contact lenses     Past Surgical History:  Procedure Laterality Date   ABDOMINAL HYSTERECTOMY  1994   partial - still has cervix, ovaries   BLADDER SUSPENSION     approximately 25 yrs ago   BREAST BIOPSY Left 2001   benign   BREAST EXCISIONAL BIOPSY Right 2011   + with rad tx    BREAST LUMPECTOMY  2011   BREAST SURGERY  2001   BIOPSY   COLONOSCOPY WITH PROPOFOL N/A 07/21/2015   Procedure: COLONOSCOPY WITH PROPOFOL;  Surgeon: SChristene Lye MD;  Location: [facility];  Service: Endoscopy;  Laterality: N/A;   COLONOSCOPY WITH PROPOFOL N/A 08/25/2019   Procedure: COLONOSCOPY WITH PROPOFOL;  Surgeon: WLucilla Lame MD;  [place]  Service: Endoscopy;  Laterality: N/A;   CYSTOCELE REPAIR N/A 01/22/2018   Procedure: ANTERIOR REPAIR (CYSTOCELE);  Surgeon: MBjorn Loser MD;  [facility];  Service: Urology;   Laterality: N/A;   CYSTOSCOPY N/A 01/22/2018   Procedure: CYSTOSCOPY;  Surgeon: MBjorn Loser MD;  [facility];  Service: Urology;  Laterality: N/A;   NM PET DX MELANOMA     TONSILLECTOMY      There were no vitals filed for this visit.   Subjective Assessment - 12/21/20 1408     Subjective Pt has been diligent with her exercises. Pt also found it to help to not jump out of bed when feeling the urge to urinate in teh of the night. Instead she takes a deep breath and rolls over to get out of bed.    Pertinent History Hx of Breast Cancer Dx 12 years ago with radiation and lumpectomy. Hysterectomy,  Bladder suspension, Rectal reconstruction sugery 1994. Second bladder suspension surgery 2019 without Pelvic PT. Pt had excessive pushing / vaccum assisted delivery with 1 st child and excessive pushing with 2nd child. 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  Pt takes tai kwon do  which she started 1 year ago.  Pt did weight lifting before COVID.    Patient Stated Goals control bladder and fecal leaks.  Pelvic Floor Special Questions - 12/21/20 1409     Prolapse None    Pelvic Floor Internal Exam pt consented verbally with no contraindications    Exam Type Vaginal    Palpation tightness at scar at 6 oclock 1-3rd layers, puborectalis B behind pubic symphysis    Strength weak squeeze, no lift   4 reps quick after cues for circular contraction, anterior still limited in upward mobility              [facility] Adult PT Treatment/Exercise - 12/21/20 1553       Neuro Re-ed    Neuro Re-ed Details  cued for anterior tilt and poster tilt of pelvis, posterior pelvic floor stretch      Moist Heat Therapy   Number Minutes Moist Heat 5 Minutes    Moist Heat Location --   perineum through sheet     Manual Therapy   Manual therapy comments STM/MWM at problem area noted in  assessment                         PT Long Term Goals - 12/21/20 1756       PT LONG TERM GOAL #1   Title Pt will demo co-ordination of deep core with against gravity activities ( sit-to -stand, walking, bending) in order to minimize back pain and pelvic floor dysfunction    Time 4    Period Weeks    Status Achieved      PT LONG TERM GOAL #2   Title Pt demo no pelvic malalignment across 2 visits in order to progress to deep core exercises      "    Time 2    Period Weeks    Status Achieved      PT LONG TERM GOAL #3   Title Pt will demo proper deep core coordination with proper diaphragmatic excursion in order to minimize pelvic pain and tolerate pelvic exams and sexual intercourse    Time 6    Period Weeks    Status Achieved      PT LONG TERM GOAL #4   Title Pt will demo IND with pelvic floor exercises in supine and seated positions to minimize risk for urinary incontinence    Time 10    Period Weeks    Status Partially Met      PT LONG TERM GOAL #5   Title Pt will demo no more forward head posture todecrease neck pain and be able to sleep on her back with proper pillows    Time 10    Period Weeks    Status Achieved      PT LONG TERM GOAL #6   Title Pt wil have no fecal leakage across one month in order to improve QOL    Time 10    Period Weeks    Status On-going                   Plan - 12/21/20 1556     Clinical Impression Statement Patient has achieved 3 out of 6 goals and is progressing well towards remaining goals.  Patient's posture, pelvic/ spinal alignment have improved significantly which is helping with her IAP system for incontinence.  Currently working on decreasing pelvic floor tightness and optimizing pelvic floor lengthening and coordination.  Patient no longer shows overuse of abdominal muscles when coordinating pelvic floor which helps to minimize straining the pelvic floor area.  Pt decided to not  continue taking [employer]  classes which is a good decision to minimize risk for injuries and pelvic floor dysfunctions ( prolapse) as classes involved high kicks and sit ups and crunches.  Plan to progress towards Kegels exercises when appropriate.  Patient continues to benefit from skilled pelvic PT.   Examination-Activity Limitations Continence;Toileting    Stability/Clinical Decision Making Evolving/Moderate complexity    Rehab Potential Good    PT Frequency 1x / week    PT Duration Other (comment)   10   PT Treatment/Interventions Functional mobility training;Moist Heat;Therapeutic activities;Therapeutic exercise;Cryotherapy;Stair training;Gait training;Neuromuscular re-education;Patient/family education;Manual techniques;Taping;Splinting;Biofeedback;Balance training;Scar mobilization;Dry needling;Energy conservation;Joint Manipulations    Consulted and Agree with Plan of Care Patient             Patient will benefit from skilled therapeutic intervention in order to improve the following deficits and impairments:  Decreased activity tolerance, Decreased endurance, Decreased range of motion, Decreased coordination, Difficulty walking, Decreased balance, Decreased mobility, Decreased scar mobility, Postural dysfunction, Improper body mechanics, Pain, Increased fascial restricitons, Decreased strength, Increased muscle spasms, Increased edema, Hypomobility  Visit Diagnosis: Other abnormalities of gait and mobility  Sacrococcygeal disorders, not elsewhere classified     Problem List Patient Active Problem List   Diagnosis Date Noted   Family history of colonic polyps    Cystocele with prolapse 01/22/2018   History of melanoma excision 12/05/2017   Bladder prolapse, female, acquired 11/09/2017   Primary osteoarthritis involving multiple joints 11/09/2017   Chronic left hip pain 11/09/2017   History of ductal carcinoma in situ (DCIS) of breast 07/18/2017   Palpitation 02/22/2015    Jerl Mina ,PT,  DPT, E-RYT  12/21/2020, 6:03 PM  [place] MAIN [facility] SERVICES [address] [place], [place], [address] Phone: [phone]   Fax:  [phone]  Name: Alyssa Bradford MRN: [id number] Date of Birth: [birth year]

## 2020-12-21 NOTE — Patient Instructions (Signed)
Notice the natural anterior tilt of pelvis with breathing

## 2020-12-28 ENCOUNTER — Encounter: Payer: 59 | Admitting: Physical Therapy

## 2021-01-05 ENCOUNTER — Ambulatory Visit: Payer: 59 | Attending: Family Medicine | Admitting: Physical Therapy

## 2021-01-05 ENCOUNTER — Other Ambulatory Visit: Payer: Self-pay

## 2021-01-05 DIAGNOSIS — R2689 Other abnormalities of gait and mobility: Secondary | ICD-10-CM

## 2021-01-05 DIAGNOSIS — M533 Sacrococcygeal disorders, not elsewhere classified: Secondary | ICD-10-CM | POA: Insufficient documentation

## 2021-01-06 ENCOUNTER — Encounter: Payer: 59 | Admitting: Physical Therapy

## 2021-01-06 NOTE — Therapy (Signed)
[place] MAIN [facility] SERVICES [address] [place], [place], [address] Phone: [phone]   Fax:  [phone]  Physical Therapy Treatment  Patient Details  Name: Alyssa Bradford MRN: [id number] Date of Birth: [birth year] Referring Provider (PT): Parks Ranger MD   Encounter Date: 01/05/2021   PT End of Session - 01/06/21 1352     Visit Number 8    Date for PT Re-Evaluation 93/79/02   Recert 4/09   PT Start Time 7353    PT Stop Time 1500    PT Time Calculation (min) 57 min    Activity Tolerance Patient tolerated treatment well             Past Medical History:  Diagnosis Date   Arthritis    hips and knees   Breast cancer ([place]) 2011   right breast with lumpectomy and rad tx in [place]   Breast cancer, stage 0, 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 Tamoxifen x 3 years, then [facility] [place] [place]    [place]    Motion sickness    Nephrolithiasis    Personal history of radiation therapy    Wears contact lenses     Past Surgical History:  Procedure Laterality Date   ABDOMINAL HYSTERECTOMY  1994   partial - still has cervix, ovaries   BLADDER SUSPENSION     approximately 25 yrs ago   BREAST BIOPSY Left 2001   benign   BREAST EXCISIONAL BIOPSY Right 2011   + with rad tx    BREAST LUMPECTOMY  2011   BREAST SURGERY  2001   BIOPSY   COLONOSCOPY WITH PROPOFOL N/A 07/21/2015   Procedure: COLONOSCOPY WITH PROPOFOL;  Surgeon: SChristene Lye MD;  Location: [facility];  Service: Endoscopy;  Laterality: N/A;   COLONOSCOPY WITH PROPOFOL N/A 08/25/2019   Procedure: COLONOSCOPY WITH PROPOFOL;  Surgeon: WLucilla Lame MD;  [place]  Service: Endoscopy;  Laterality: N/A;   CYSTOCELE REPAIR N/A 01/22/2018   Procedure: ANTERIOR REPAIR (CYSTOCELE);  Surgeon: MBjorn Loser MD;  [facility];  Service: Urology;   Laterality: N/A;   CYSTOSCOPY N/A 01/22/2018   Procedure: CYSTOSCOPY;  Surgeon: MBjorn Loser MD;  [facility];  Service: Urology;  Laterality: N/A;   NM PET DX MELANOMA     TONSILLECTOMY      There were no vitals filed for this visit.   Subjective Assessment - 01/05/21 1611     Subjective Pt experienced leakage when getting to the bathroom in the middle of the night. Pt goes twice a night.    Pertinent History Hx of Breast Cancer Dx 12 years ago with radiation and lumpectomy. Hysterectomy,  Bladder suspension, Rectal reconstruction sugery 1994. Second bladder suspension surgery 2019 without Pelvic PT. Pt had excessive pushing / vaccum assisted delivery with 1 st child and excessive pushing with 2nd child. 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  Pt takes tai kwon do  which she started 1 year ago.  Pt did weight lifting before COVID.    Patient Stated Goals control bladder and fecal leaks.                            Pelvic Floor Special Questions - 01/06/21 1348     Prolapse None  Pelvic Floor Internal Exam pt consented verbally with no contraindications    Exam Type Vaginal    Palpation tightness at scar at 6 oclock 1-3rd layers and R ischial rami, puborectalis B behind pubic symphysis,    Strength fair squeeze, definite lift   4 reps quick after cues for circular contraction, anterior still limited in upward mobility   Strength # of reps 6    Strength # of seconds 1               [facility] Adult PT Treatment/Exercise - 01/06/21 1350       Neuro Re-ed    Neuro Re-ed Details  cue for more feet co-activation with pelvic floor and deep core level HEP , cued for anterior pelvic floor , cued for quick contractions in supine and seated position                          PT Long Term Goals - 12/21/20 1756       PT LONG TERM GOAL #1   Title Pt will demo co-ordination of  deep core with against gravity activities ( sit-to -stand, walking, bending) in order to minimize back pain and pelvic floor dysfunction    Time 4    Period Weeks    Status Achieved      PT LONG TERM GOAL #2   Title Pt demo no pelvic malalignment across 2 visits in order to progress to deep core exercises      "    Time 2    Period Weeks    Status Achieved      PT LONG TERM GOAL #3   Title Pt will demo proper deep core coordination with proper diaphragmatic excursion in order to minimize pelvic pain and tolerate pelvic exams and sexual intercourse    Time 6    Period Weeks    Status Achieved      PT LONG TERM GOAL #4   Title Pt will demo IND with pelvic floor exercises in supine and seated positions to minimize risk for urinary incontinence    Time 10    Period Weeks    Status Partially Met      PT LONG TERM GOAL #5   Title Pt will demo no more forward head posture todecrease neck pain and be able to sleep on her back with proper pillows    Time 10    Period Weeks    Status Achieved      PT LONG TERM GOAL #6   Title Pt wil have no fecal leakage across one month in order to improve QOL    Time 10    Period Weeks    Status On-going                   Plan - 01/06/21 1353     Clinical Impression Statement Pt progressed to quick contraction of pelvic floor training today in supine and seated positions. Pt required manual Tx to minimize remaining tightness at posterior and anterior pelvic floor mm. Following Tx, pt was able to demo more sequential and circumferential contraction of pelvic floor. Withholding long contractions until all remainder tightness of pelvic floor have improved. Pt required cues for more anterior tilt of pelvic floor to elicit stronger contraction. Pt continues to benefit from skilled PT.    Examination-Activity Limitations Continence;Toileting    Stability/Clinical Decision Making Evolving/Moderate complexity    Rehab Potential Good  PT  Frequency 1x / week    PT Duration Other (comment)   10   PT Treatment/Interventions Functional mobility training;Moist Heat;Therapeutic activities;Therapeutic exercise;Cryotherapy;Stair training;Gait training;Neuromuscular re-education;Patient/family education;Manual techniques;Taping;Splinting;Biofeedback;Balance training;Scar mobilization;Dry needling;Energy conservation;Joint Manipulations    Consulted and Agree with Plan of Care Patient             Patient will benefit from skilled therapeutic intervention in order to improve the following deficits and impairments:  Decreased activity tolerance, Decreased endurance, Decreased range of motion, Decreased coordination, Difficulty walking, Decreased balance, Decreased mobility, Decreased scar mobility, Postural dysfunction, Improper body mechanics, Pain, Increased fascial restricitons, Decreased strength, Increased muscle spasms, Increased edema, Hypomobility  Visit Diagnosis: Sacrococcygeal disorders, not elsewhere classified  Other abnormalities of gait and mobility     Problem List Patient Active Problem List   Diagnosis Date Noted   Family history of colonic polyps    Cystocele with prolapse 01/22/2018   History of melanoma excision 12/05/2017   Bladder prolapse, female, acquired 11/09/2017   Primary osteoarthritis involving multiple joints 11/09/2017   Chronic left hip pain 11/09/2017   History of ductal carcinoma in situ (DCIS) of breast 07/18/2017   Palpitation 02/22/2015    Jerl Mina, PT 01/06/2021, 1:54 PM  [place] MAIN [facility] SERVICES [address] [place], [place], [address] Phone: [phone]   Fax:  [phone]  Name: Alyssa Bradford MRN: [id number] Date of Birth: May 18, 1960

## 2021-01-06 NOTE — Patient Instructions (Signed)
PELVIC FLOOR / KEGEL EXERCISES   Pelvic floor/ Kegel exercises are used to strengthen the muscles in the base of your pelvis that are responsible for supporting your pelvic organs and preventing urine/feces leakage. Based on your therapist's recommendations, they can be performed while standing, sitting, or lying down. Imagine pelvic floor area as a diamond with pelvic landmarks: top =pubic bone, bottom tip=tailbone, sides=sitting bones (ischial tuberosities).    Make yourself aware of this muscle group by using these cues while coordinating your breath: Inhale, feel pelvic floor diamond area lower like hammock towards your feet and ribcage/belly expanding. Pause. Let the exhale naturally and feel your belly sink, abdominal muscles hugging in around you and you may notice the pelvic diamond draws upward towards your head forming a umbrella shape. Give a squeeze during the exhalation like you are stopping the flow of urine. If you are squeezing the buttock muscles, try to give 50% less effort.   Common Errors: Breath holding: If you are holding your breath, you may be bearing down against your bladder instead of pulling it up. If you belly bulges up while you are squeezing, you are holding your breath. Be sure to breathe gently in and out while exercising. Counting out loud may help you avoid holding your breath. Accessory muscle use: You should not see or feel other muscle movement when performing pelvic floor exercises. When done properly, no one can tell that you are performing the exercises. Keep the buttocks, belly and inner thighs relaxed. Overdoing it: Your muscles can fatigue and stop working for you if you over-exercise. You may actually leak more or feel soreness at the lower abdomen or rectum.  YOUR HOME EXERCISE PROGRAM    SHORT HOLDS: Position: on back, sitting  Inhale and then exhale. Then squeeze the muscle.  (Be sure to let belly sink in with exhales and not push outward) Perform  5 repetitions, 5 different  Times/day                      DECREASE DOWNWARD PRESSURE ON  YOUR PELVIC FLOOR, ABDOMINAL, LOW BACK MUSCLES       PRESERVE YOUR PELVIC HEALTH LONG-TERM   ** SQUEEZE pelvic floor BEFORE YOUR SNEEZE, COUGH, LAUGH   ** EXHALE BEFORE YOU RISE AGAINST GRAVITY (lifting, sit to stand, from squat to stand)   ** LOG ROLL OUT OF BED INSTEAD OF CRUNCH/SIT-UP

## 2021-01-20 ENCOUNTER — Encounter: Payer: 59 | Admitting: Physical Therapy

## 2021-01-31 ENCOUNTER — Encounter: Payer: 59 | Admitting: Physical Therapy

## 2021-02-17 ENCOUNTER — Ambulatory Visit: Payer: 59 | Admitting: Physical Therapy

## 2021-02-17 ENCOUNTER — Ambulatory Visit: Payer: Self-pay | Admitting: *Deleted

## 2021-02-17 NOTE — Telephone Encounter (Signed)
Reason for Disposition  [1] Continuous (nonstop) coughing interferes with work or school AND [2] no improvement using cough treatment per Care Advice  Answer Assessment - Initial Assessment Questions 1. COVID-19 DIAGNOSIS: "Who made your COVID-19 diagnosis?" "Was it confirmed by a positive lab test or self-test?" If not diagnosed by a doctor (or NP/PA), ask "Are there lots of cases (community spread) where you live?" Note: See public health department website, if unsure.     Positive covid results at [facility] today  2. COVID-19 EXPOSURE: "Was there any known exposure to COVID before the symptoms began?" CDC Definition of close contact: within 6 feet (2 meters) for a total of 15 minutes or more over a 24-hour period.      Unknown .  Noted to attend baby shower on Sunday 3. ONSET: "When did the COVID-19 symptoms start?"      Wednesday 02/16/21 4. WORST SYMPTOM: "What is your worst symptom?" (e.g., cough, fever, shortness of breath, muscle aches)     Headache, cough, fever, chills, runny nose  5. COUGH: "Do you have a cough?" If Yes, ask: "How bad is the cough?"       Yes coughing spells  6. FEVER: "Do you have a fever?" If Yes, ask: "What is your temperature, how was it measured, and when did it start?"     99 .9 30 min or less ago . Has been as high 100.4-103 7. RESPIRATORY STATUS: "Describe your breathing?" (e.g., shortness of breath, wheezing, unable to speak)      No  8. BETTER-SAME-WORSE: "Are you getting better, staying the same or getting worse compared to yesterday?"  If getting worse, ask, "In what way?"     Worse  9. HIGH RISK DISEASE: "Do you have any chronic medical problems?" (e.g., asthma, heart or lung disease, weak immune system, obesity, etc.)     No hx pneumonia  10. VACCINE: "Have you had the COVID-19 vaccine?" If Yes, ask: "Which one, how many shots, when did you get it?"       X2 pfizer  11. BOOSTER: "Have you received your COVID-19 booster?" If Yes, ask: "Which one and  when did you get it?"       X1 pfizer in December  12. PREGNANCY: "Is there any chance you are pregnant?" "When was your last menstrual period?"       na 13. OTHER SYMPTOMS: "Do you have any other symptoms?"  (e.g., chills, fatigue, headache, loss of smell or taste, muscle pain, sore throat)       Headache , chills, fever, cough, congestion, runny nose  14. O2 SATURATION MONITOR:  "Do you use an oxygen saturation monitor (pulse oximeter) at home?" If Yes, ask "What is your reading (oxygen level) today?" "What is your usual oxygen saturation reading?" (e.g., 95%)       na  Protocols used: Coronavirus (COVID-19) Diagnosed or Suspected-A-AH

## 2021-02-17 NOTE — Telephone Encounter (Signed)
Pt called in stating she just tested positive for COVID today and is running a fever, stated her fever ran up to 103 and wanted some advice.       Called patient to review symptoms. Patient tested positive for covid today at [facility]. Symptoms started 02/16/21, headache, fever as high as 103 now 99.9. chills ,  coughing with coughing episodes and congestion , runny nose mucus clear . Able to eat and drink. Eating soups and drinking gingerale. Denies chest pain, difficulty breathing. Patient concerned due to hx pneumonia . Was tested for influenza A and B and negative  virtual visit scheduled for 02/21/21. Earliest available . Requesting at home covid testing kits. Please advise if patient can get antiviral medication. Care advise and quarantine isolation precautions reviewed. Patient verbalized understanding and to call back or go to ED if symptoms worsen.

## 2021-02-18 ENCOUNTER — Ambulatory Visit: Payer: Self-pay | Admitting: *Deleted

## 2021-02-18 NOTE — Telephone Encounter (Signed)
Patient called in states coughing up brown phlegm today, and has headache. She already spoke to nurse about this yesterday, and was given advice, but she  is  asking if she can she be sent antiviral medication to [id number] - [place], [place]  Phone: [phone]  Fax: [phone]

## 2021-02-18 NOTE — Telephone Encounter (Signed)
Patient called in states coughing up brown phlegm today, and has headache. She already spoke to nurse about this yesterday, and was given advice, but she  is  asking if she can she be sent antiviral medication to [id number] - [place], [place]  Phone: [phone]  Fax: [phone]   Called patient back regarding symptoms. Patient was already triaged 02/17/21 and now coughing up brown color phlegm. No longer reports fever. Instructed patient to try E- visit online and speak with a provider or she may need to go to ED for evaluation for medication . Please advise . Care advise given. Patient verbalized understanding of care advise and to call back or go to [facility] or ED for worsening symptoms.

## 2021-02-21 ENCOUNTER — Encounter: Payer: Self-pay | Admitting: Family Medicine

## 2021-02-21 ENCOUNTER — Telehealth (INDEPENDENT_AMBULATORY_CARE_PROVIDER_SITE_OTHER): Payer: 59 | Admitting: Family Medicine

## 2021-02-21 VITALS — Ht 68.0 in | Wt 160.0 lb

## 2021-02-21 DIAGNOSIS — U071 COVID-19: Secondary | ICD-10-CM

## 2021-02-21 MED ORDER — IPRATROPIUM BROMIDE 0.06 % NA SOLN: 2.0000 | Freq: Four times a day (QID) | NASAL | 0 refills | Status: DC

## 2021-02-21 NOTE — Progress Notes (Signed)
Virtual Visit via Telephone The purpose of this virtual visit is to provide medical care while limiting exposure to the novel coronavirus (COVID19) for both patient and office staff.  Consent was obtained for phone visit:  Yes.   Answered questions that patient had about telehealth interaction:  Yes.   I discussed the limitations, risks, security and privacy concerns of performing an evaluation and management service by telephone. I also discussed with the patient that there may be a patient responsible charge related to this service. The patient expressed understanding and agreed to proceed.  Patient Location: Home Provider Location: [facility] (Office)  Participants in virtual visit: - Patient: Alyssa Bradford - CMA: Orinda Kenner, [place] - Provider: Dr Parks Ranger  ---------------------------------------------------------------------- Chief Complaint  Patient presents with   Covid Positive   Cough   Headache    S: Reviewed CMA documentation. I have called patient and gathered additional HPI as follows:  COVID19 Positive Reports that symptoms started Wednesday 10/26 with coughing productive and mucus, and then following day Thursday 10/27 tested positive for COVID. She also admits 2 weeks prior of cold symptoms sinus congestion coughing and negative COVID test x 2, she was gtiven Tessalon perls per Urgent care for cough. She is using saline nasal spray. She was taking decongestants, mucinex, OTC medications, she then progressed to have fever on 10/27 and was taking motrin, ibuprofen nsaid, still having coughing and mucus production. Also taking Mucinex, DayQuil, NyQuil - Tried to get COVID Booster updated omicron back 2 weeks ago, did not have card. They did not give her vaccine. She said that her last booster was 03/2020.  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 Tamoxifen x 3 years, then exemestane. [facility], [place], [place].    Melanoma ([place])    Motion sickness    Nephrolithiasis    Personal history of radiation therapy    Wears contact lenses    Social History   Tobacco Use   Smoking status: Never   Smokeless tobacco: Never  Vaping Use   Vaping Use: Never used  Substance Use Topics   Alcohol use: Yes    Comment: occasional   Drug use: No    Current Outpatient Medications:    doxylamine, Sleep, (UNISOM) 25 MG tablet, Take 1 tablet (25 mg total) by mouth at bedtime as needed., Disp: 30 tablet, Rfl:    ipratropium (ATROVENT) 0.06 % nasal spray, Place 2 sprays into both nostrils 4 (four) times daily. For up to 5-7 days then stop., Disp: 15 mL, Rfl: 0  Depression screen [facility] 2/9 10/19/2020 03/17/2020 12/11/2018  Decreased Interest 0 0 0  Down, Depressed, Hopeless 0 0 0  PHQ - 2 Score 0 0 0  Altered sleeping 1 - -  Tired, decreased energy 0 - -  Change in appetite 0 - -  Feeling bad or failure about yourself  0 - -  Trouble concentrating 0 - -  Moving slowly or fidgety/restless 0 - -  Suicidal thoughts 0 - -  PHQ-9 Score 1 - -  Difficult doing work/chores Not  difficult at all - -    GAD 7 : Generalized Anxiety Score 10/19/2020  Nervous, Anxious, on Edge 0  Control/stop worrying 0  Worry too much - different things 0  Trouble relaxing 0  Restless 0  Easily annoyed or irritable 0  Afraid - awful might happen 0  Total GAD 7 Score 0  Anxiety Difficulty Not difficult at all    -------------------------------------------------------------------------- O: No physical exam performed due to remote telephone encounter.  Lab results  reviewed.  No results found for this or any previous visit (from the past 2160 hour(s)).  -------------------------------------------------------------------------- A&P:  Problem List Items Addressed This Visit   None Visit Diagnoses     COVID-19 virus infection    -  Primary   Relevant Medications   ipratropium (ATROVENT) 0.06 % nasal spray      COVID19 positive Symptom 1st onset 10/26 Confirm home test positive 10/27 Boosted currently Note was sick with URI 2 weeks prior, negative COVID Mild to moderate symptoms currently. No red flags or dyspnea Now gradual improvement already Afebrile Age >60 for risk factor Not in window of therapy for anti virals Supportive care OTC PRN discussed regimen Start Atrovent nasal spray decongestant 2 sprays in each nostril up to 4 times daily for 7 days Consider Prednisone taper 6 day, may refill Tessalon Perls PRN, or other options discussed nasal steroid Follow-up criteria given.   Meds ordered this encounter  Medications   ipratropium (ATROVENT) 0.06 % nasal spray    Sig: Place 2 sprays into both nostrils 4 (four) times daily. For up to 5-7 days then stop.    Dispense:  15 mL    Refill:  0    Follow-up: - Return PRN if not improved COVID19  Patient verbalizes understanding with the above medical recommendations including the limitation of remote medical advice.  Specific follow-up and call-back criteria were given for patient to follow-up or seek medical care more urgently if needed.   - Time spent in direct consultation with patient on phone: 15 minutes   Nobie Putnam, [facility] 02/21/2021, 8:52 AM

## 2021-02-21 NOTE — Patient Instructions (Addendum)
Start Atrovent nasal spray decongestant 2 sprays in each nostril up to 4 times daily for 7 days Consider Prednisone taper 6 day, may refill Tessalon Perls PRN, or other options discussed nasal steroid  Please schedule a Follow-up Appointment to: Return if symptoms worsen or fail to improve.  If you have any other questions or concerns, please feel free to call the office or send a message through [place]. You may also schedule an earlier appointment if necessary.  Additionally, you may be receiving a survey about your experience at our office within a few days to 1 week by e-mail or mail. We value your feedback.  Nobie Putnam, DO [place]

## 2021-03-01 ENCOUNTER — Ambulatory Visit: Payer: 59 | Attending: Family Medicine | Admitting: Physical Therapy

## 2021-03-01 ENCOUNTER — Encounter: Payer: Self-pay | Admitting: Physical Therapy

## 2021-03-01 ENCOUNTER — Other Ambulatory Visit: Payer: Self-pay

## 2021-03-01 DIAGNOSIS — M533 Sacrococcygeal disorders, not elsewhere classified: Secondary | ICD-10-CM | POA: Diagnosis present

## 2021-03-01 DIAGNOSIS — R2689 Other abnormalities of gait and mobility: Secondary | ICD-10-CM | POA: Insufficient documentation

## 2021-03-01 NOTE — Patient Instructions (Signed)
  KEEP UP WITH   7 quick reps    Deep core level 1 Deep core level 2    7 quick reps after   __   Clams  ___   body mechanics    Log rolling out and into bed  Mini squat for lifting  __  WAY TO GO , [place]!!!

## 2021-03-02 NOTE — Therapy (Signed)
[place] MAIN [facility] SERVICES [address] [place], [place], [address] Phone: [phone]   Fax:  [phone]  Physical Therapy  / Discharge Summary across 9 visits.  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 [facility]oxifen x 3 [facility], [facility] [place]   tested + 02/15/21.  high fever, cough, fatigue, HA,   Melanoma ([facility])    Motion sickness    Nephrolithiasis    Personal history of radiation therapy    Wears contact lenses     Past Surgical History:  Procedure Laterality Date   ABDOMINAL HYSTERECTOMY  1994   partial - still has cervix, ovaries   BLADDER SUSPENSION     approximately 25 yrs ago   BREAST BIOPSY Left 2001   benign   BREAST EXCISIONAL BIOPSY Right 2011   + with rad tx    BREAST LUMPECTOMY  2011   BREAST SURGERY  2001   BIOPSY   COLONOSCOPY WITH PROPOFOL N/A 07/21/2015   Procedure: COLONOSCOPY WITH PROPOFOL;  Surgeon: SChristene Lye MD;  Location: [facility] ENDOSCOPY;  Service: Endoscopy;  Laterality: N/A;   COLONOSCOPY WITH PROPOFOL N/A 08/25/2019   Procedure: COLONOSCOPY WITH PROPOFOL;  Surgeon: [place] Lame MD;  [place] [facility]  Service: Endoscopy;  Laterality: N/A;   CYSTOCELE REPAIR N/A 01/22/2018   Procedure: ANTERIOR  REPAIR (CYSTOCELE);  Surgeon: MBjorn Loser MD;  Location: [facility];  Service: Urology;  Laterality: N/A;   CYSTOSCOPY N/A 01/22/2018   Procedure: CYSTOSCOPY;  Surgeon: MBjorn Loser MD;  Location: [facility];  Service: Urology;  Laterality: N/A;   NM PET DX MELANOMA     TONSILLECTOMY      There were no vitals filed for this visit.   Subjective Assessment - 03/01/21 1609     Subjective Pt had  tested COVID 2 weeks ago and still has cough and some phlegm. When taking medications and coughing, pt had leakage. But since she is coughing less and taking less medication, leakage is better.    Pertinent History Hx of Breast Cancer Dx 12 years ago with radiation and lumpectomy. Hysterectomy,  Bladder suspension, Rectal reconstruction sugery 1994. Second bladder suspension surgery 2019 without Pelvic PT. Pt had excessive pushing / vaccum assisted delivery with 1 st child and excessive pushing with 2nd child. 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  Pt takes tai kwon do  which she started 1 year ago.  Pt did weight lifting before COVID.    Patient Stated Goals control bladder and fecal leaks.  [facility] PT Assessment - 03/02/21 1251       Strength   Overall Strength Comments hip abd 4+/5 no pain                        Pelvic Floor Special Questions - 03/02/21 1258     External Palpation through clothing: 7 reps, 1 sec,  no cues requried               [facility] Adult PT Treatment/Exercise - 03/02/21 1251       Therapeutic Activites    Other Therapeutic Activities reviwed HEP, discussed d/c and continuation of HEP , reassessed goals                          PT Long Term Goals - 03/01/21 1614       PT LONG TERM GOAL #1   Title Pt will demo co-ordination of deep core with against gravity activities ( sit-to -stand, walking, bending) in order to minimize back pain and  pelvic floor dysfunction    Time 4    Period Weeks    Status Achieved      PT LONG TERM GOAL #2   Title Pt demo no pelvic malalignment across 2 visits in order to progress to deep core exercises      "    Time 2    Period Weeks    Status Achieved      PT LONG TERM GOAL #3   Title Pt will demo proper deep core coordination with proper diaphragmatic excursion in order to minimize pelvic pain and tolerate pelvic exams and sexual intercourse    Time 6    Period Weeks    Status Achieved      PT LONG TERM GOAL #4   Title Pt will demo IND with pelvic floor exercises in supine and seated positions to minimize risk for urinary incontinence    Time 10    Period Weeks    Status Achieved      PT LONG TERM GOAL #5   Title Pt will demo no more forward head posture todecrease neck pain and be able to sleep on her back with proper pillows    Time 10    Period Weeks    Status Achieved      PT LONG TERM GOAL #6   Title Pt wil have no fecal leakage across one month in order to improve QOL    Baseline 03/01/21: once in last month when she was sick    Time 10    Period Weeks    Status Partially Met                   Plan - 03/01/21 1613     Clinical Impression Statement Pt has achieved 5/6 goals and has had significantly less fecal and urinary leakage with improved FOTO scores. Pt has been able to travel on 3 trips with less concerns / issues about incontinence.   Pt showed corrected spinal and pelvic misalignment, no more forward head and slumped posture,  improved deep core coordination with no more downward force of pelvic floor, improved upward position of pelvic organs, proper pelvic floor contractions, and improved deep core and hip abduction strength. Pt is ready for d/c at this time. Pt voiced understanding for continuation of HEP.    Examination-Activity Limitations Continence;Toileting    Stability/Clinical Decision Making Evolving/Moderate complexity      Rehab Potential Good     PT Frequency 1x / week    PT Duration Other (comment)   10   PT Treatment/Interventions Functional mobility training;Moist Heat;Therapeutic activities;Therapeutic exercise;Cryotherapy;Stair training;Gait training;Neuromuscular re-education;Patient/family education;Manual techniques;Taping;Splinting;Biofeedback;Balance training;Scar mobilization;Dry needling;Energy conservation;Joint Manipulations    Consulted and Agree with Plan of Care Patient             Patient will benefit from skilled therapeutic intervention in order to improve the following deficits and impairments:  Decreased activity tolerance, Decreased endurance, Decreased range of motion, Decreased coordination, Difficulty walking, Decreased balance, Decreased mobility, Decreased scar mobility, Postural dysfunction, Improper body mechanics, Pain, Increased fascial restricitons, Decreased strength, Increased muscle spasms, Increased edema, Hypomobility  Visit Diagnosis: Sacrococcygeal disorders, not elsewhere classified  Other abnormalities of gait and mobility     Problem List Patient Active Problem List   Diagnosis Date Noted   Family history of colonic polyps    Cystocele with prolapse 01/22/2018   History of melanoma excision 12/05/2017   Bladder prolapse, female, 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

## 2021-03-15 ENCOUNTER — Other Ambulatory Visit: Payer: Self-pay | Admitting: Family Medicine

## 2021-03-15 ENCOUNTER — Encounter: Payer: 59 | Admitting: Physical Therapy

## 2021-03-15 DIAGNOSIS — U071 COVID-19: Secondary | ICD-10-CM

## 2021-03-15 NOTE — Telephone Encounter (Signed)
Requested medication (s) are due for refill today: yes  Requested medication (s) are on the active medication list: yes   Last refill: 02/21/21  15  0 refills  Future visit scheduled  no  Notes to clinic:off protocol, please review  Requested Prescriptions  Pending Prescriptions Disp Refills   ipratropium (ATROVENT) 0.06 % nasal spray [Pharmacy Med Name: IPRATROPIUM 0.06% SPRAY]      Sig: Place 2 sprays into both nostrils 4 (four) times daily. For up to 5-7 days then stop.     Off-Protocol Failed - 03/15/2021 11:31 AM      Failed - Medication not assigned to a protocol, review manually.      Passed - Valid encounter within last 12 months    Recent Outpatient Visits           3 weeks ago COVID-19 virus infection   [place], DO   4 months ago Hoarseness of voice   [place], DO   12 months ago Annual physical exam   [facility] Olin Hauser, DO   1 year ago Rectal bleeding   [place], DO   2 years ago Annual physical exam   [facility] Olin Hauser, DO       Future Appointments             In 3 weeks Amalia Hailey Nyoka Lint, MD [facility]           Off-Protocol Failed - 03/15/2021 11:31 AM      Failed - Medication not assigned to a protocol, review manually.      Passed - Valid encounter within last 12 months    Recent Outpatient Visits           3 weeks ago COVID-19 virus infection   [place], DO   4 months ago Hoarseness of voice   [place], DO   12 months ago Annual physical exam   [facility] Olin Hauser, DO   1 year ago Rectal bleeding   [place], DO   2 years ago Annual physical exam   [facility] Olin Hauser, DO       Future Appointments             In 3 weeks Amalia Hailey, Nyoka Lint, MD [facility]

## 2021-03-29 ENCOUNTER — Encounter: Payer: 59 | Admitting: Physical Therapy

## 2021-04-05 ENCOUNTER — Encounter: Payer: 59 | Admitting: Obstetrics and Gynecology

## 2021-04-12 ENCOUNTER — Encounter: Payer: 59 | Admitting: Physical Therapy

## 2022-02-02 ENCOUNTER — Other Ambulatory Visit: Payer: Self-pay | Admitting: Family Medicine

## 2022-02-02 DIAGNOSIS — Z1231 Encounter for screening mammogram for malignant neoplasm of breast: Secondary | ICD-10-CM

## 2022-04-25 ENCOUNTER — Other Ambulatory Visit: Payer: Self-pay

## 2022-04-25 ENCOUNTER — Ambulatory Visit (INDEPENDENT_AMBULATORY_CARE_PROVIDER_SITE_OTHER): Payer: 59 | Admitting: Family Medicine

## 2022-04-25 ENCOUNTER — Encounter: Payer: Self-pay | Admitting: Family Medicine

## 2022-04-25 VITALS — BP 136/88 | HR 72 | Ht 68.0 in | Wt 174.6 lb

## 2022-04-25 DIAGNOSIS — Z0000 Encounter for general adult medical examination without abnormal findings: Secondary | ICD-10-CM

## 2022-04-25 NOTE — Patient Instructions (Addendum)
Thank you for coming to the office today.  IBS symptoms Likely mucus and bowel dysregulation is due to some underlying dysfunction with IBS Also likely some weakened rectal tone / sphincter OTC Peppermint Oil (Triple Coated Capsule) '180mg'$  take one 3 times daily to reduce diarrhea  DUE for FASTING BLOOD WORK (no food or drink after midnight before the lab appointment, only water or coffee without cream/sugar on the morning of)  SCHEDULE "Lab Only" visit in the morning at the clinic for lab draw in 1 YEAR  - Make sure Lab Only appointment is at about 1 week before your next appointment, so that results will be available  For Lab Results, once available within 2-3 days of blood draw, you can can log in to MyChart online to view your results and a brief explanation. Also, we can discuss results at next follow-up visit.   Please schedule a Follow-up Appointment to: Return in about 1 year (around 04/26/2023) for Fasting lab only Weds 04/26/22 915am then next year Jan 2024 fasting lab then 1 wk later Annual.  If you have any other questions or concerns, please feel free to call the office or send a message through [facility]. You may also schedule an earlier appointment if necessary.  Additionally, you may be receiving a survey about your experience at our office within a few days to 1 week by e-mail or mail. We value your feedback.  Nobie Putnam, DO [facility]

## 2022-04-25 NOTE — Progress Notes (Signed)
Subjective:    Patient ID: Alyssa Bradford, female    DOB: [birth year], 62 y.o.   MRN: [id number]  Alyssa Bradford is a 62 y.o. female presenting on 04/25/2022 for Annual Exam   HPI  Here for Annual Physical and Lab Review.   Lifestyle BMI >25 She is doing well overall. Weight gain 2 years with 10-14 lbs, she has now recently lost 8 lbs and doing well with diet - Diet: healthy balanced, high fiber cereal, fruits / veggies daily, high protein with chicken and weight watchers - She walks a lot, 12,000 steps daily - Regarding sleeping, she has some difficulty, takes OTC sleep aid and melatonin, mixed results.   Additional history   Constipation / Anal Leakage w/ diarrhea vs fecal incontinence Reports episodic issues, with constipation and diarrhea episodes. She had earlier colonoscopy recently 08/2019 due to this problem - but did not see any problems that explained this. 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 She has issues with pelvic floor as a result. - She doesn't have significant urge to have BM. But occasionally will get a mucus or narrow stool that can come out with incontinence.  She did Pelvic Floor P.T back in 2021 with improvement of her bladder dysfunction. Still has some anal leakage issue with mucus and bowel dysregulation Tried Probiotic for past 2 weeks at night, she had issues before intermittently taking it in the past.  Fam History Parkinsons Mother and Maternal Grandfather with Parkinsons She had a few falls recently, a few recent accidental falls. [place] on a curb. She had issues with difficulty lifting her feet up.     Health Maintenance:  UTD COVID Booster and Flu Shot 2-3 weeks ago She had COVID infection 12/21   Mammogram scheduled 05/10/22   Due for Pap smear cervical cancer screening, prior partial hysterectomy 1994.No further pap smears, previous has been negative. She was asked to f/u with GYN    Colonoscopy screening last done 08/25/19, Dr Allen Norris [facility] - clean without any polyps taken, repeat in 5 years, 08/24/24     04/25/2022    1:39 PM 10/19/2020    2:42 PM 03/17/2020    1:24 PM  Depression screen PHQ 2/9  Decreased Interest 0 0 0  Down, Depressed, Hopeless 0 0 0  PHQ - 2 Score 0 0 0  Altered sleeping  1   Tired, decreased energy  0   Change in appetite  0   Feeling bad or failure about yourself   0   Trouble concentrating  0   Moving slowly or fidgety/restless  0   Suicidal thoughts  0   PHQ-9 Score  1   Difficult doing work/chores  Not difficult at all     Past Medical History:  Diagnosis Date   Arthritis    hips and knees   Breast cancer ([place]) 2011   right breast with lumpectomy and rad tx in [place]   Breast cancer, stage 0, 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 Tamoxifen x 3 years, [facility]   tested + 02/15/21.  high fever, cough, fatigue, HA,   Melanoma ([facility])    Motion sickness    Nephrolithiasis    Personal history of radiation therapy    Wears contact lenses    Past Surgical History:  Procedure Laterality Date  ABDOMINAL HYSTERECTOMY  1994   partial - still has cervix, ovaries   BLADDER SUSPENSION     approximately 25 yrs ago   BREAST BIOPSY Left 2001   benign   BREAST EXCISIONAL BIOPSY Right 2011   + with rad tx    BREAST LUMPECTOMY  2011   BREAST SURGERY  2001   BIOPSY   COLONOSCOPY WITH PROPOFOL N/A 07/21/2015   Procedure: COLONOSCOPY WITH PROPOFOL;  Surgeon: Christene Lye, MD;  Location: [facility];  Service: Endoscopy;  Laterality: N/A;   COLONOSCOPY WITH PROPOFOL N/A 08/25/2019   Procedure: COLONOSCOPY WITH PROPOFOL;  Surgeon: Lucilla Lame, MD;  Location: [facility];  Service: Endoscopy;  Laterality: N/A;   CYSTOCELE REPAIR N/A 01/22/2018   Procedure: ANTERIOR REPAIR (CYSTOCELE);  Surgeon: Bjorn Loser, MD;  Location: [facility];  Service: Urology;  Laterality: N/A;   CYSTOSCOPY N/A 01/22/2018   Procedure: CYSTOSCOPY;  Surgeon: Bjorn Loser, MD;  Location: [facility];  Service: Urology;  Laterality: N/A;   NM PET DX MELANOMA     TONSILLECTOMY     Social History   Socioeconomic History   Marital status: Married    Spouse name: Not on file   Number of children: Not on file   Years of education: Not on file   Highest education level: Not on file  Occupational History   Occupation: retired Economist, owner of business  Tobacco Use   Smoking status: Never   Smokeless tobacco: Never  Vaping Use   Vaping Use: Never used  Substance and Sexual Activity   Alcohol use: Yes    Comment: occasional   Drug use: No   Sexual activity: Not on file  Other Topics Concern   Not on file  Social History Narrative   Not on file   Social Determinants of Health   Financial Resource Strain: Not on file  Food Insecurity: Not on file  Transportation Needs: Not on file  Physical Activity: Not on file  Stress: Not on file  Social Connections: Not on file  Intimate Partner Violence: Not on file   Family History  Problem Relation Age of Onset   Cancer Father        BLADDER /SKIN    Heart disease Father        Stent placement & CABG x 3   Heart attack Father 22   Breast cancer Maternal Grandmother        70's   Breast cancer Paternal Grandmother        60's   Current Outpatient Medications on File Prior to Visit  Medication Sig   doxylamine, Sleep, (UNISOM) 25 MG tablet Take 1 tablet (25 mg total) by mouth at bedtime as needed.   No current facility-administered medications on file prior to visit.    Review of Systems  Constitutional:  Negative for activity change, appetite change, chills, diaphoresis, fatigue and fever.  HENT:  Negative for congestion and hearing loss.   Eyes:  Negative for visual disturbance.  Respiratory:  Negative for cough, chest tightness,  shortness of breath and wheezing.   Cardiovascular:  Negative for chest pain, palpitations and leg swelling.  Gastrointestinal:  Negative for abdominal pain, constipation, diarrhea, nausea and vomiting.  Genitourinary:  Negative for dysuria, frequency and hematuria.  Musculoskeletal:  Negative for arthralgias and neck pain.  Skin:  Negative for rash.  Neurological:  Negative for dizziness, weakness, light-headedness, numbness and headaches.  Hematological:  Negative for adenopathy.  Psychiatric/Behavioral:  Negative  for behavioral problems, dysphoric mood and sleep disturbance.    Per HPI unless specifically indicated above     Objective:    BP 136/88   Pulse 72   Ht '5\' 8"'$  (1.727 m)   Wt 174 lb 9.6 oz (79.2 kg)   SpO2 99%   BMI 26.55 kg/m   Wt Readings from Last 3 Encounters:  04/25/22 174 lb 9.6 oz (79.2 kg)  02/21/21 160 lb (72.6 kg)  10/19/20 166 lb 6.4 oz (75.5 kg)    Physical Exam Vitals and nursing note reviewed.  Constitutional:      General: She is not in acute distress.    Appearance: She is well-developed. She is not diaphoretic.     Comments: Well-appearing, comfortable, cooperative  HENT:     Head: Normocephalic and atraumatic.  Eyes:     General:        Right eye: No discharge.        Left eye: No discharge.     Conjunctiva/sclera: Conjunctivae normal.     Pupils: Pupils are equal, round, and reactive to light.  Neck:     Thyroid: No thyromegaly.  Cardiovascular:     Rate and Rhythm: Normal rate and regular rhythm.     Pulses: Normal pulses.     Heart sounds: Normal heart sounds. No murmur heard. Pulmonary:     Effort: Pulmonary effort is normal. No respiratory distress.     Breath sounds: Normal breath sounds. No wheezing or rales.  Abdominal:     General: Bowel sounds are normal. There is no distension.     Palpations: Abdomen is soft. There is no mass.     Tenderness: There is no abdominal tenderness.  Musculoskeletal:        General: No  tenderness. Normal range of motion.     Cervical back: Normal range of motion and neck supple.     Right lower leg: No edema.     Left lower leg: No edema.     Comments: Upper / Lower Extremities: - Normal muscle tone, strength bilateral upper extremities 5/5, lower extremities 5/5  Lymphadenopathy:     Cervical: No cervical adenopathy.  Skin:    General: Skin is warm and dry.     Findings: No erythema or rash.  Neurological:     Mental Status: She is alert and oriented to person, place, and time.     Comments: Distal sensation intact to light touch all extremities  Psychiatric:        Mood and Affect: Mood normal.        Behavior: Behavior normal.        Thought Content: Thought content normal.     Comments: Well groomed, good eye contact, normal speech and thoughts     Results for orders placed or performed in visit on 03/12/20  Hemoglobin A1c  Result Value Ref Range   Hgb A1c MFr Bld 5.3 <5.7 % of total Hgb   Mean Plasma Glucose 105 (calc)   eAG (mmol/L) 5.8 (calc)  CBC with Differential/Platelet  Result Value Ref Range   WBC 4.6 3.8 - 10.8 Thousand/uL   RBC 4.36 3.80 - 5.10 Million/uL   Hemoglobin 13.7 11.7 - 15.5 g/dL   HCT 40.4 35.0 - 45.0 %   MCV 92.7 80.0 - 100.0 fL   MCH 31.4 27.0 - 33.0 pg   MCHC 33.9 32.0 - 36.0 g/dL   RDW 12.1 11.0 - 15.0 %   Platelets 220 140 - 400  Thousand/uL   MPV 10.8 7.5 - 12.5 fL   Neutro Abs 2,778 1,500 - 7,800 cells/uL   Lymphs Abs 1,233 850 - 3,900 cells/uL   Absolute Monocytes 391 200 - 950 cells/uL   Eosinophils Absolute 129 15 - 500 cells/uL   Basophils Absolute 69 0 - 200 cells/uL   Neutrophils Relative % 60.4 %   Total Lymphocyte 26.8 %   Monocytes Relative 8.5 %   Eosinophils Relative 2.8 %   Basophils Relative 1.5 %  COMPLETE METABOLIC PANEL WITH GFR  Result Value Ref Range   Glucose, Bld 77 65 - 99 mg/dL   BUN 16 7 - 25 mg/dL   Creat 0.74 0.50 - 0.99 mg/dL   GFR, Est Non African American 88 > OR = 60 mL/min/1.86m    GFR, Est African American 102 > OR = 60 mL/min/1.760m  BUN/Creatinine Ratio NOT APPLICABLE 6 - 22 (calc)   Sodium 141 135 - 146 mmol/L   Potassium 4.1 3.5 - 5.3 mmol/L   Chloride 103 98 - 110 mmol/L   CO2 30 20 - 32 mmol/L   Calcium 9.4 8.6 - 10.4 mg/dL   Total Protein 6.9 6.1 - 8.1 g/dL   Albumin 4.5 3.6 - 5.1 g/dL   Globulin 2.4 1.9 - 3.7 g/dL (calc)   AG Ratio 1.9 1.0 - 2.5 (calc)   Total Bilirubin 0.6 0.2 - 1.2 mg/dL   Alkaline phosphatase (APISO) 61 37 - 153 U/L   AST 24 10 - 35 U/L   ALT 9 6 - 29 U/L  Lipid panel  Result Value Ref Range   Cholesterol 193 <200 mg/dL   HDL 68 > OR = 50 mg/dL   Triglycerides 89 <150 mg/dL   LDL Cholesterol (Calc) 107 (H) mg/dL (calc)   Total CHOL/HDL Ratio 2.8 <5.0 (calc)   Non-HDL Cholesterol (Calc) 125 <130 mg/dL (calc)  TSH  Result Value Ref Range   TSH 2.88 0.40 - 4.50 mIU/L      Assessment & Plan:   Problem List Items Addressed This Visit   None Visit Diagnoses     Annual physical exam    -  Primary       Updated Health Maintenance information Fasting labs ordered for tomorrow 04/26/22 Encouraged improvement to lifestyle with diet and exercise Goal of maintain healthy weight, reviewed BMI at healthy weight and goals  Fam History Parkinsons Discussed that her current falling symptoms are not characteristic alone of parkinsons, and provided reassurance, I understand her concern with strong fam history, and goal is to closely monitor. She keeps herself physically fit and emphasis on modifying activity to avoid injury and risk of fall. She may follow up at any time if worsening concerns and requesting Neurology consultation to eval for parkinson concerns.  IBS symptoms Likely mucus and bowel dysregulation is due to some underlying dysfunction with IBS Also likely some weakened rectal tone / sphincter OTC Peppermint Oil (Triple Coated Capsule) '180mg'$  take one 3 times daily to reduce diarrhea  No orders of the defined types were  placed in this encounter.    No orders of the defined types were placed in this encounter.    Follow up plan: Return in about 1 year (around 04/26/2023) for Fasting lab only Weds 04/26/22 915am then next year Jan 2024 fasting lab then 1 wk later Annual.  Future labs tomorrow 1/3 CMET CBC Lipid A1c TSH  AlNobie PutnamDO [facility] 04/25/2022, 1:21 PM

## 2022-04-26 ENCOUNTER — Other Ambulatory Visit: Payer: 59

## 2022-04-28 ENCOUNTER — Other Ambulatory Visit: Payer: 59

## 2022-04-29 ENCOUNTER — Encounter: Payer: Self-pay | Admitting: Family Medicine

## 2022-04-29 DIAGNOSIS — B351 Tinea unguium: Secondary | ICD-10-CM

## 2022-04-29 LAB — COMPREHENSIVE METABOLIC PANEL
AG Ratio: 2 (calc) (ref 1.0–2.5)
ALT: 8 U/L (ref 6–29)
AST: 23 U/L (ref 10–35)
Albumin: 4.5 g/dL (ref 3.6–5.1)
Alkaline phosphatase (APISO): 54 U/L (ref 37–153)
BUN: 12 mg/dL (ref 7–25)
CO2: 27 mmol/L (ref 20–32)
Calcium: 9 mg/dL (ref 8.6–10.4)
Chloride: 105 mmol/L (ref 98–110)
Creat: 0.77 mg/dL (ref 0.50–1.05)
Globulin: 2.3 g/dL (calc) (ref 1.9–3.7)
Glucose, Bld: 84 mg/dL (ref 65–99)
Potassium: 4.3 mmol/L (ref 3.5–5.3)
Sodium: 141 mmol/L (ref 135–146)
Total Bilirubin: 0.4 mg/dL (ref 0.2–1.2)
Total Protein: 6.8 g/dL (ref 6.1–8.1)

## 2022-04-29 LAB — CBC WITH DIFFERENTIAL/PLATELET
Absolute Monocytes: 372 cells/uL (ref 200–950)
Basophils Absolute: 51 cells/uL (ref 0–200)
Basophils Relative: 1 %
Eosinophils Absolute: 133 cells/uL (ref 15–500)
Eosinophils Relative: 2.6 %
HCT: 38.1 % (ref 35.0–45.0)
Hemoglobin: 13 g/dL (ref 11.7–15.5)
Lymphs Abs: 1571 cells/uL (ref 850–3900)
MCH: 31.3 pg (ref 27.0–33.0)
MCHC: 34.1 g/dL (ref 32.0–36.0)
MCV: 91.8 fL (ref 80.0–100.0)
MPV: 10.8 fL (ref 7.5–12.5)
Monocytes Relative: 7.3 %
Neutro Abs: 2973 cells/uL (ref 1500–7800)
Neutrophils Relative %: 58.3 %
Platelets: 256 10*3/uL (ref 140–400)
RBC: 4.15 10*6/uL (ref 3.80–5.10)
RDW: 12.2 % (ref 11.0–15.0)
Total Lymphocyte: 30.8 %
WBC: 5.1 10*3/uL (ref 3.8–10.8)

## 2022-04-29 LAB — LIPID PANEL
Cholesterol: 186 mg/dL (ref <200)
HDL: 64 mg/dL (ref >=50)
LDL Cholesterol (Calc): 105 mg/dL (calc) — ABNORMAL HIGH
Non-HDL Cholesterol (Calc): 122 mg/dL (calc) (ref <130)
Total CHOL/HDL Ratio: 2.9 (calc) (ref <5.0)
Triglycerides: 77 mg/dL (ref <150)

## 2022-04-29 LAB — HEMOGLOBIN A1C
Hgb A1c MFr Bld: 5.3 % of total Hgb (ref <5.7)
Mean Plasma Glucose: 105 mg/dL
eAG (mmol/L): 5.8 mmol/L

## 2022-04-29 LAB — TSH: TSH: 3.54 mIU/L (ref 0.40–4.50)

## 2022-05-01 MED ORDER — TERBINAFINE HCL 250 MG PO TABS: 250.0000 mg | ORAL_TABLET | Freq: Every day | ORAL | 2 refills | Status: DC

## 2022-05-10 ENCOUNTER — Ambulatory Visit
Admission: RE | Admit: 2022-05-10 | Discharge: 2022-05-10 | Disposition: A | Discharge status: Home / Self Care | Payer: 59 | Source: Ambulatory Visit | Attending: Family Medicine | Admitting: Family Medicine

## 2022-05-10 DIAGNOSIS — Z1231 Encounter for screening mammogram for malignant neoplasm of breast: Secondary | ICD-10-CM | POA: Diagnosis present

## 2022-08-02 ENCOUNTER — Ambulatory Visit: Payer: 59 | Admitting: Podiatry

## 2022-08-29 ENCOUNTER — Ambulatory Visit: Payer: Self-pay

## 2022-08-29 ENCOUNTER — Encounter: Payer: Self-pay | Admitting: Family Medicine

## 2022-08-29 ENCOUNTER — Ambulatory Visit (INDEPENDENT_AMBULATORY_CARE_PROVIDER_SITE_OTHER): Payer: 59 | Admitting: Family Medicine

## 2022-08-29 VITALS — BP 122/78 | HR 82 | Ht 68.0 in | Wt 175.0 lb

## 2022-08-29 DIAGNOSIS — B029 Zoster without complications: Secondary | ICD-10-CM | POA: Diagnosis not present

## 2022-08-29 MED ORDER — VALACYCLOVIR HCL 1 G PO TABS: 1000.0000 mg | ORAL_TABLET | Freq: Three times a day (TID) | ORAL | 0 refills | Status: DC

## 2022-08-29 MED ORDER — PREDNISONE 10 MG PO TABS: ORAL_TABLET | ORAL | 0 refills | Status: DC

## 2022-08-29 MED ORDER — GABAPENTIN 100 MG PO CAPS: ORAL_CAPSULE | ORAL | 0 refills | Status: DC

## 2022-08-29 NOTE — Progress Notes (Signed)
Subjective:    Patient ID: Alyssa Bradford, female    DOB: [birth year], 62 y.o.   MRN: [id number]  Alyssa Bradford is a 62 y.o. female presenting on 08/29/2022 for Rash (On neck, started 5 days ago.  Patient describes as burning.  Has tried calaminae and benadryl.  Also has a headache.  History of shingles vaccine.)  Patient presents for a same day appointment.  HPI  Shingles  Reports rash appeared Thursday Left side face/neck. Initially with some itching, thought to be poison ivy. She tried topical calamine lotion and benadryl. Then symptoms progressed to more painful burning. Reported some blistering with the rash. Improved some. Not spreading and no other areas of concern. No eye or ear or sensory involvement.   Health Maintenance:  Shingrix vaccine updated 08/2021 and 10/2021      08/29/2022    2:02 PM 04/25/2022    1:39 PM 10/19/2020    2:42 PM  Depression screen PHQ 2/9  Decreased Interest 0 0 0  Down, Depressed, Hopeless 0 0 0  PHQ - 2 Score 0 0 0  Altered sleeping   1  Tired, decreased energy   0  Change in appetite   0  Feeling bad or failure about yourself    0  Trouble concentrating   0  Moving slowly or fidgety/restless   0  Suicidal thoughts   0  PHQ-9 Score   1  Difficult doing work/chores   Not difficult at all    Social History   Tobacco Use   Smoking status: Never   Smokeless tobacco: Never  Vaping Use   Vaping Use: Never used  Substance Use Topics   Alcohol use: Yes    Comment: occasional   Drug use: No    Review of Systems Per HPI unless specifically indicated above     Objective:    BP 122/78   Pulse 82   Ht 5\' 8"  (1.727 m)   Wt 175 lb (79.4 kg)   SpO2 97%   BMI 26.61 kg/m   Wt Readings from Last 3 Encounters:  08/29/22 175 lb (79.4 kg)  04/25/22 174 lb 9.6 oz (79.2 kg)  02/21/21 160 lb (72.6 kg)    Physical Exam Vitals and nursing note reviewed.  Constitutional:      General: She is not in acute distress.    Appearance: Normal appearance.  She is well-developed. She is not diaphoretic.     Comments: Well-appearing, comfortable, cooperative  HENT:     Head: Normocephalic and atraumatic.  Eyes:     General:        Right eye: No discharge.        Left eye: No discharge.     Conjunctiva/sclera: Conjunctivae normal.  Cardiovascular:     Rate and Rhythm: Normal rate.  Pulmonary:     Effort: Pulmonary effort is normal.  Skin:    General: Skin is warm and dry.     Findings: Rash (Left side of chin/face neck localized blistering rash healing now dried.) present. No erythema.  Neurological:     Mental Status: She is alert and oriented to person, place, and time.  Psychiatric:        Mood and Affect: Mood normal.        Behavior: Behavior normal.        Thought Content: Thought content normal.     Comments: Well groomed, good eye contact, normal speech and thoughts    Results for orders placed or performed  in visit on 04/25/22  HgB A1c  Result Value Ref Range   Hgb A1c MFr Bld 5.3 <5.7 % of total Hgb   Mean Plasma Glucose 105 mg/dL   eAG (mmol/L) 5.8 mmol/L  CBC with Differential  Result Value Ref Range   WBC 5.1 3.8 - 10.8 Thousand/uL   RBC 4.15 3.80 - 5.10 Million/uL   Hemoglobin 13.0 11.7 - 15.5 g/dL   HCT 16.1 09.6 - 04.5 %   MCV 91.8 80.0 - 100.0 fL   MCH 31.3 27.0 - 33.0 pg   MCHC 34.1 32.0 - 36.0 g/dL   RDW 40.9 81.1 - 91.4 %   Platelets 256 140 - 400 Thousand/uL   MPV 10.8 7.5 - 12.5 fL   Neutro Abs 2,973 1,500 - 7,800 cells/uL   Lymphs Abs 1,571 850 - 3,900 cells/uL   Absolute Monocytes 372 200 - 950 cells/uL   Eosinophils Absolute 133 15 - 500 cells/uL   Basophils Absolute 51 0 - 200 cells/uL   Neutrophils Relative % 58.3 %   Total Lymphocyte 30.8 %   Monocytes Relative 7.3 %   Eosinophils Relative 2.6 %   Basophils Relative 1.0 %  Comprehensive Metabolic Panel (CMET)  Result Value Ref Range   Glucose, Bld 84 65 - 99 mg/dL   BUN 12 7 - 25 mg/dL   Creat 7.82 9.56 - 2.13 mg/dL   BUN/Creatinine  Ratio SEE NOTE: 6 - 22 (calc)   Sodium 141 135 - 146 mmol/L   Potassium 4.3 3.5 - 5.3 mmol/L   Chloride 105 98 - 110 mmol/L   CO2 27 20 - 32 mmol/L   Calcium 9.0 8.6 - 10.4 mg/dL   Total Protein 6.8 6.1 - 8.1 g/dL   Albumin 4.5 3.6 - 5.1 g/dL   Globulin 2.3 1.9 - 3.7 g/dL (calc)   AG Ratio 2.0 1.0 - 2.5 (calc)   Total Bilirubin 0.4 0.2 - 1.2 mg/dL   Alkaline phosphatase (APISO) 54 37 - 153 U/L   AST 23 10 - 35 U/L   ALT 8 6 - 29 U/L  Lipid panel  Result Value Ref Range   Cholesterol 186 <200 mg/dL   HDL 64 > OR = 50 mg/dL   Triglycerides 77 <086 mg/dL   LDL Cholesterol (Calc) 105 (H) mg/dL (calc)   Total CHOL/HDL Ratio 2.9 <5.0 (calc)   Non-HDL Cholesterol (Calc) 122 <130 mg/dL (calc)  TSH  Result Value Ref Range   TSH 3.54 0.40 - 4.50 mIU/L      Assessment & Plan:   Problem List Items Addressed This Visit   None Visit Diagnoses     Herpes zoster without complication    -  Primary   Relevant Medications   valACYclovir (VALTREX) 1000 MG tablet   gabapentin (NEURONTIN) 100 MG capsule   predniSONE (DELTASONE) 10 MG tablet       Clinically consistent with new acute shingles herpes zoster outbreak onset 5-6 days ago, with mild to moderate neuropathic pain. Currently up to date s/p Shingrix vaccine already 1 year ago  - No evidence of complications, such as ocular involvement  Plan: 1. Start Valacyclovir 1000mg  TID with food x 7 days, counseled on treatment course and side effects 2. Discussed potential course of post-herpetic neuralgia symptoms - trial Gabapentin, Prednisone 3. Counseling on infectious nature of problem with avoiding close contact with high risk populations until rash resolved. 4. Follow-up as needed within 4-6 weeks if not resolving or if residual pain, strict return criteria  if facial or ocular involvement or other acute concerns   Meds ordered this encounter  Medications   valACYclovir (VALTREX) 1000 MG tablet    Sig: Take 1 tablet (1,000 mg  total) by mouth 3 (three) times daily. For shingles    Dispense:  21 tablet    Refill:  0   gabapentin (NEURONTIN) 100 MG capsule    Sig: Start 1 capsule daily, increase by 1 cap every 2-3 days as tolerated up to 3 times a day, or may take 3 at once in evening.    Dispense:  90 capsule    Refill:  0   predniSONE (DELTASONE) 10 MG tablet    Sig: Take 6 tabs with breakfast Day 1, 5 tabs Day 2, 4 tabs Day 3, 3 tabs Day 4, 2 tabs Day 5, 1 tab Day 6.    Dispense:  21 tablet    Refill:  0      Follow up plan: Return if symptoms worsen or fail to improve.  Saralyn Pilar, DO [facility] 08/29/2022, 2:35 PM

## 2022-08-29 NOTE — Patient Instructions (Signed)
Thank you for coming to the office today.  Start the anti-viral medication - Valtrex take one 3 times a day for 7 days to complete the entire course. This will help the current flare up heal, including the rash to dry up and resolve quicker. It may take several days to weeks for the rash to completely resolve. Typically it will dry up and scab off.  Blister and Rash Care Take a cool bath or apply cool compresses to the area of the rash or blisters as directed by your health care provider. This may help with pain and itching. Keep your rash covered with a loose bandage (dressing). Wear loose-fitting clothing to help ease the pain of material rubbing against the rash. Keep your rash and blisters clean with mild soap and cool water or as directed by your health care provider. Check your rash every day for signs of infection. These include redness, swelling, and pain that lasts or increases. Do not pick your blisters. Do not scratch your rash.  As mentioned, some patients experience "Post-Herpetic Neuralgia" or a persistent sensation or feeling of "nerve irritation or pain" that can last for days to weeks to months or longer in this same area.  You may try the nerve medicine to help ease these symptoms now or in the future if it is needed. Try Gabapentin 100mg  capsules, take at night for 2-3 nights only, and then increase to 2 times a day for a few days, and then may increase to 3 times a day, it may make you drowsy, if helps significantly at night only, then you can increase instead to 3 capsules at night, instead of 3 times a day - In the future if needed, we can significantly increase the dose if tolerated well, some common doses are 300mg  three times a day up to 600mg  three times a day, usually it takes several weeks or months to get to higher doses  Added a Prednisone taper if still need extra.  It is contagious to patients who have never had Chicken Pox - or someone who is pregnant (unborn baby  is at risk) - or elderly with weakened immune system. Try to avoid direct close contact with others on the skin if you have active blisters. Once the rash dries up and heals, it is less of a concern.    Contact your doctor if: Your pain is not relieved with prescribed medicines. Your pain does not get better after the rash heals. Your rash looks infected. Signs of infection include redness, swelling, and pain that lasts or increases.  Please seek more immediate medical attention at the [facility] Emergency Department IF The rash is on your face or nose. You have facial pain, pain around your eye area, or loss of feeling on one side of your face. You have ear pain or you have ringing in your ear. You have loss of taste. Your condition gets worse.    ------------------------------------------------------------------------------------------------------- Please review the additional information below about Shingles, if you have additional concerns.  Shingles Shingles, which is also known as herpes zoster, is an infection that causes a painful skin rash and fluid-filled blisters. Shingles is not related to genital herpes, which is a sexually transmitted infection. Shingles only develops in people who: Have had chickenpox. Have received the chickenpox vaccine. (This is rare.)  What are the causes? Shingles is caused by varicella-zoster virus (VZV). This is the same virus that causes chickenpox. After exposure to VZV, the virus stays in the body  in an inactive (dormant) state. Shingles develops if the virus reactivates. This can happen many years after the initial exposure to VZV. It is not known what causes this virus to reactivate. What increases the risk? People who have had chickenpox or received the chickenpox vaccine are at risk for shingles. Infection is more common in people who: Are older than age 63. Have a weakened defense (immune) system, such as those with HIV, AIDS, or  cancer. Are taking medicines that weaken the immune system, such as transplant medicines. Are under great stress.  What are the signs or symptoms? Early symptoms of this condition include itching, tingling, and pain in an area on your skin. Pain may be described as burning, stabbing, or throbbing. A few days or weeks after symptoms start, a painful red rash appears, usually on one side of the body in a bandlike or beltlike pattern. The rash eventually turns into fluid-filled blisters that break open, scab over, and dry up in about 2-3 weeks. At any time during the infection, you may also develop: A fever. Chills. A headache. An upset stomach.  How is this diagnosed? This condition is diagnosed with a skin exam. Sometimes, skin or fluid samples are taken from the blisters before a diagnosis is made. These samples are examined under a microscope or sent to a lab for testing. How is this treated? There is no specific cure for this condition. Your health care provider will probably prescribe medicines to help you manage pain, recover more quickly, and avoid long-term problems. Medicines may include: Antiviral drugs. Anti-inflammatory drugs. Pain medicines.  If the area involved is on your face, you may be referred to a specialist, such as an eye doctor (ophthalmologist) or an ear, nose, and throat (ENT) doctor to help you avoid eye problems, chronic pain, or disability. Follow these instructions at home: Medicines Take medicines only as directed by your health care provider. Apply an anti-itch or numbing cream to the affected area as directed by your health care provider. General instructions Rest as directed by your health care provider. Keep all follow-up visits as directed by your health care provider. This is important. Until your blisters scab over, your infection can cause chickenpox in people who have never had it or been vaccinated against it. To prevent this from happening, avoid  contact with other people, especially: Babies. Pregnant women. Children who have eczema. Elderly people who have transplants. People who have chronic illnesses, such as leukemia or AIDS. This information is not intended to replace advice given to you by your health care provider. Make sure you discuss any questions you have with your health care provider. Document Released: 04/10/2005 Document Revised: 12/05/2015 Document Reviewed: 02/19/2014 Elsevier Interactive Patient Education  2018 [employer].      Please schedule a Follow-up Appointment to: No follow-ups on file.  If you have any other questions or concerns, please feel free to call the office or send a message through MyChart. You may also schedule an earlier appointment if necessary.  Additionally, you may be receiving a survey about your experience at our office within a few days to 1 week by e-mail or mail. We value your feedback.  Saralyn Pilar, DO [facility], [place]

## 2022-08-29 NOTE — Telephone Encounter (Signed)
  Chief Complaint: shingles Symptoms: rash on lower part of face into chin and neck, itching burning and slightly painful Frequency: last Thursday night Pertinent Negatives: Disposition: [] ED /[] Urgent Care (no appt availability in office) / [x] Appointment(In office/virtual)/ []  [facility]/ [] Home Care/ [] Refused Recommended Disposition /[] [facility]/ []  Follow-up with PCP Additional Notes: pt states she thinks she may have shingles. Has had vaccine but never virus before. Pt states she thought was poison oak at first. Has been applying a type of cream but no improvement. Scheduled pt for OV today at 1400 with PCP.   Reason for Disposition  [1] Shingles rash AND [2] onset > 72 hours ago (3 days)  Answer Assessment - Initial Assessment Questions 1. APPEARANCE of RASH: "Describe the rash."      Red bumps  2. LOCATION: "Where is the rash located?"      L side of face and neck  3. ONSET: "When did the rash start?"      Thursday night 4. ITCHING: "Does the rash itch?" If Yes, ask: "How bad is the itch?"  (Scale 1-10; or mild, moderate, severe)     mild 5. PAIN: "Does the rash hurt?" If Yes, ask: "How bad is the pain?"  (Scale 0-10; or none, mild, moderate, severe)    - NONE (0): no pain    - MILD (1-3): doesn't interfere with normal activities     - MODERATE (4-7): interferes with normal activities or awakens from sleep     - SEVERE (8-10): excruciating pain, unable to do any normal activities     Pain and burning  6. OTHER SYMPTOMS: "Do you have any other symptoms?" (e.g., fever)  Protocols used: Shingles (Zoster)-A-AH

## 2022-08-30 ENCOUNTER — Ambulatory Visit (INDEPENDENT_AMBULATORY_CARE_PROVIDER_SITE_OTHER): Payer: 59

## 2022-08-30 ENCOUNTER — Ambulatory Visit: Payer: 59 | Admitting: Podiatry

## 2022-08-30 DIAGNOSIS — M778 Other enthesopathies, not elsewhere classified: Secondary | ICD-10-CM

## 2022-08-30 DIAGNOSIS — M205X1 Other deformities of toe(s) (acquired), right foot: Secondary | ICD-10-CM | POA: Diagnosis not present

## 2022-08-30 DIAGNOSIS — M2021 Hallux rigidus, right foot: Secondary | ICD-10-CM | POA: Diagnosis not present

## 2022-08-30 NOTE — Progress Notes (Addendum)
Subjective:  Patient ID: Alyssa Bradford, female    DOB: 05-Jul-1960,  MRN: [id number] HPI Chief Complaint  Patient presents with   Foot Pain    Patient came in today for right foot swelling and pain,     62 y.o. female presents with the above complaint.   ROS: Denies fever chills nausea vomit muscle aches pains calf pain back pain chest pain shortness of breath.  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 Tamoxifen x 3 years, then exemestane. [facility], [place], [place].    COVID-19    tested + 02/15/21.  high fever, cough, fatigue, HA,   Melanoma (HCC)    Motion sickness    Nephrolithiasis    Personal history of radiation therapy    Wears contact lenses    Past Surgical History:  Procedure Laterality Date   ABDOMINAL HYSTERECTOMY  1994   partial - still has cervix, ovaries   BLADDER SUSPENSION     approximately 25 yrs ago   BREAST BIOPSY Left 2001   benign   BREAST EXCISIONAL BIOPSY Right 2011   + with rad tx    BREAST LUMPECTOMY  2011   BREAST SURGERY  2001   BIOPSY   COLONOSCOPY WITH PROPOFOL N/A 07/21/2015   Procedure: COLONOSCOPY WITH PROPOFOL;  Surgeon: Kieth Brightly, MD;  Location: [facility] END[facility]COPY;  Service: Endoscopy;  Laterality: N/A;   COLONOSCOPY WITH PROPOFOL N/A 08/25/2019   Procedure: COLONOSCOPY WITH PROPOFOL;  Surgeon: Midge Minium, MD;  Location: [facility];  Service: Endoscopy;  Laterality: N/A;   CYSTOCELE REPAIR N/A 01/22/2018   Procedure: ANTERIOR REPAIR (CYSTOCELE);  Surgeon: Alfredo Martinez, MD;  Location: [facility];  Service: Urology;  Laterality: N/A;   CYSTOSCOPY N/A 01/22/2018   Procedure: CYSTOSCOPY;  Surgeon: Alfredo Martinez, MD;  Location: [facility];  Service: Urology;  Laterality: N/A;   NM  PET DX MELANOMA     TONSILLECTOMY      Current Outpatient Medications:    doxylamine, Sleep, (UNISOM) 25 MG tablet, Take 1 tablet (25 mg total) by mouth at bedtime as needed., Disp: 30 tablet, Rfl:    gabapentin (NEURONTIN) 100 MG capsule, Start 1 capsule daily, increase by 1 cap every 2-3 days as tolerated up to 3 times a day, or may take 3 at once in evening., Disp: 90 capsule, Rfl: 0   predniSONE (DELTASONE) 10 MG tablet, Take 6 tabs with breakfast Day 1, 5 tabs Day 2, 4 tabs Day 3, 3 tabs Day 4, 2 tabs Day 5, 1 tab Day 6. (Patient not taking: Reported on 08/30/2022), Disp: 21 tablet, Rfl: 0   valACYclovir (VALTREX) 1000 MG tablet, Take 1 tablet (1,000 mg total) by mouth 3 (three) times daily. For shingles, Disp: 21 tablet, Rfl: 0  Allergies  Allergen Reactions   Latex Rash    Contact dermatitis (balloons, gloves, elastic)   Review of Systems Objective:  There were no vitals filed for this visit.  General: Well developed, nourished, in no acute distress, alert and oriented x3   Dermatological: Skin is warm, dry and supple bilateral. Nails x 10 are well maintained; remaining integument appears unremarkable at this time. There are no open sores, no preulcerative lesions, no rash or signs of  infection present.  Vascular: Dorsalis Pedis artery and Posterior Tibial artery pedal pulses are 2/4 bilateral with immedate capillary fill time. Pedal hair growth present. No varicosities and no lower extremity edema present bilateral.   Neruologic: Grossly intact via light touch bilateral. Vibratory intact via tuning fork bilateral. Protective threshold with Semmes Wienstein monofilament intact to all pedal sites bilateral. Patellar and Achilles deep tendon reflexes 2+ bilateral. No Babinski or clonus noted bilateral.   Musculoskeletal: No gross boney pedal deformities bilateral. No pain, crepitus, or limitation noted with foot and ankle range of motion bilateral. Muscular strength 5/5 in all groups  tested bilateral.  She has pain on range of motion with limited range of motion of the first metatarsophalangeal joint of the right foot this is a large bulbous metatarsal head with obvious some spurring soft tissue fluid collections most likely ganglion type cyst or bursa.  Gait: Unassisted, Nonantalgic.    Radiographs:  Radiographs taken today demonstrate severe osteoarthritic changes of the first metatarsal phalangeal joint of the right foot.  There is flattening of the head of the first metatarsal and the base of the proximal phalanx of the hallux dorsal spurring with an intra-articular ossicle of bone.  Subchondral sclerosis eburnation is noted.  Assessment & Plan:   Assessment: Hallux rigidus with osteoarthritis first metatarsophalangeal joint right foot  Plan: Discussed etiology pathology conservative versus surgical therapies discussed the pros and cons of fusion versus a Keller arthroplasty with sole goal silicone implant.  At this point were going to consent her for single silicone implant and she understood this is amenable to it we discussed possible postop complications associated with this which may include but not limited to postop pain bleeding swelling infection recurrence need for further surgery overcorrection under correction loss of digit loss limb loss of life.  We discussed the surgery center as well as anesthesia group.  She understands that she is will have a block in the right leg and that she will be wearing a cam walker.  She will be limited to her time up and she will start exercise immediately months her leg wakes from anesthesia.  She will receive postop pain medication and antibiotics and antiemetic.  These will be called in a few days prior to procedure.     Kaida [employer] [place], [place]

## 2022-09-04 ENCOUNTER — Telehealth: Payer: Self-pay | Admitting: Urology

## 2022-09-04 NOTE — Telephone Encounter (Signed)
DOS - 09/22/22  KELLER BUNION IMPLANT RIGHT ---- [id number]  AETNA   PER AETNA'S AUTOMATED SYSTEM FOR CPT CODE 16109 NO PRIOR AUTH IS REQUIRED.    CALL REF # [id number]

## 2022-09-20 ENCOUNTER — Other Ambulatory Visit: Payer: Self-pay | Admitting: Podiatry

## 2022-09-20 MED ORDER — ONDANSETRON HCL 4 MG PO TABS: 4.0000 mg | ORAL_TABLET | Freq: Three times a day (TID) | ORAL | 0 refills | Status: DC | PRN

## 2022-09-20 MED ORDER — CEPHALEXIN 500 MG PO CAPS: 500.0000 mg | ORAL_CAPSULE | Freq: Three times a day (TID) | ORAL | 0 refills | Status: DC

## 2022-09-20 MED ORDER — OXYCODONE-ACETAMINOPHEN 10-325 MG PO TABS: 1.0000 | ORAL_TABLET | Freq: Three times a day (TID) | ORAL | 0 refills | Status: AC | PRN

## 2022-09-22 DIAGNOSIS — M205X1 Other deformities of toe(s) (acquired), right foot: Secondary | ICD-10-CM | POA: Diagnosis not present

## 2022-09-27 ENCOUNTER — Ambulatory Visit (INDEPENDENT_AMBULATORY_CARE_PROVIDER_SITE_OTHER): Payer: 59

## 2022-09-27 ENCOUNTER — Encounter: Payer: Self-pay | Admitting: Podiatry

## 2022-09-27 ENCOUNTER — Ambulatory Visit (INDEPENDENT_AMBULATORY_CARE_PROVIDER_SITE_OTHER): Payer: 59 | Admitting: Podiatry

## 2022-09-27 DIAGNOSIS — M2021 Hallux rigidus, right foot: Secondary | ICD-10-CM

## 2022-09-27 DIAGNOSIS — Z9889 Other specified postprocedural states: Secondary | ICD-10-CM

## 2022-09-27 NOTE — Progress Notes (Signed)
She presents today for her first postop visit date of surgery 09/22/2022 status post Lorenz Coaster arthroplasty and single silicone implant first metatarsophalangeal joint of the right foot.  She denies fever chills nausea vomiting other than associated with the pain medication.  No shortness of breath no chest pain.  Objective: Vital signs stable alert and oriented x 3.  Presents in her cam boot.  Once removed demonstrates dry sterile dressing intact once removed demonstrates mild edema no erythema cellulitis drainage or odor she has some tenderness initially on range of motion but that subsides.  She has good plantarflexion and dorsiflexion.  Radiographs taken today demonstrate osseously mature individual good placement of the [facility] arthroplasty with single silicone implant.  Assessment: Well-healing surgical foot.  Plan: Redressed today dressed a compressive dressing follow-up with me in 1 to 2 weeks at which time we will transition to a small Darco shoe and compression anklet.

## 2022-10-11 ENCOUNTER — Ambulatory Visit (INDEPENDENT_AMBULATORY_CARE_PROVIDER_SITE_OTHER): Payer: 59 | Admitting: Podiatry

## 2022-10-11 ENCOUNTER — Encounter: Payer: Self-pay | Admitting: Podiatry

## 2022-10-11 DIAGNOSIS — M2021 Hallux rigidus, right foot: Secondary | ICD-10-CM

## 2022-10-11 DIAGNOSIS — Z9889 Other specified postprocedural states: Secondary | ICD-10-CM

## 2022-10-11 NOTE — Progress Notes (Signed)
She presents today for her second postop visit date of surgery was 09/22/2018 for [facility] arthroplasty single silicone implant right foot.  She states that she is doing very well no problems whatsoever.  She denies fever chills nausea vomit muscle aches pains calf pain back pain chest pain shortness of breath.  Objective: Vital signs are stable alert oriented x 3 dressed her dressing intact once removed demonstrates no erythema some mild edema in the first intermetatarsal space incision site is healing very nicely.  She has great range of motion of the first metatarsophalangeal joint no pain on range of motion.  And no calf pain.  Assessment: Well-healing surgical foot.  Plan: Remove sutures today placed her in a compression anklet in a Darco shoe encouraged range of motion exercises instructed her not to walk barefoot yet keep it elevated is much as possible and will follow-up with me in the middle of July.  She will most likely transition to a tennis shoe within the next 2 weeks

## 2022-10-25 ENCOUNTER — Encounter: Payer: 59 | Admitting: Podiatry

## 2022-11-08 ENCOUNTER — Ambulatory Visit (INDEPENDENT_AMBULATORY_CARE_PROVIDER_SITE_OTHER): Payer: 59 | Admitting: Podiatry

## 2022-11-08 ENCOUNTER — Ambulatory Visit (INDEPENDENT_AMBULATORY_CARE_PROVIDER_SITE_OTHER): Payer: 59

## 2022-11-08 ENCOUNTER — Encounter: Payer: Self-pay | Admitting: Podiatry

## 2022-11-08 DIAGNOSIS — M2021 Hallux rigidus, right foot: Secondary | ICD-10-CM

## 2022-11-08 DIAGNOSIS — Z9889 Other specified postprocedural states: Secondary | ICD-10-CM

## 2022-11-08 NOTE — Progress Notes (Signed)
She presents today for follow-up of her Keller arthroplasty single silicone implant right foot.  States that it feels okay is just really swollen still and can only wear this type of shoe and she demonstrates a track open sandal.  Objective: Vital signs are stable she is alert and oriented x 3.  She does have moderate edema to the first metatarsophalangeal joint of the right foot but she does have good range of motion.  Radiographs taken today demonstrate implant is intact there is still soft tissue swelling but much decreased from previous evaluation.  Assessment: Well-healing surgical foot.  Plan: Discussed physical therapy with her in great detail today discussed at home therapy and ways to deal with the swelling and I will follow-up with her in 1 month

## 2022-12-20 ENCOUNTER — Ambulatory Visit: Payer: 59

## 2022-12-20 ENCOUNTER — Ambulatory Visit: Payer: 59 | Admitting: Podiatry

## 2022-12-20 ENCOUNTER — Encounter: Payer: Self-pay | Admitting: Podiatry

## 2022-12-20 DIAGNOSIS — M2021 Hallux rigidus, right foot: Secondary | ICD-10-CM

## 2022-12-20 DIAGNOSIS — Z9889 Other specified postprocedural states: Secondary | ICD-10-CM

## 2022-12-20 MED ORDER — METHYLPREDNISOLONE 4 MG PO TBPK: ORAL_TABLET | ORAL | 0 refills | Status: DC

## 2022-12-20 NOTE — Progress Notes (Signed)
She presents today for a follow-up of her Lorenz Coaster arthroplasty and single silicone implant.  She states that it still swells a lot and when I am able to get my foot in his shoe it really hurts under here as she points to the second metatarsal phalangeal joint of the right foot.  States that she hit her second toe and thinks she may have broken it.  But she did not want x-rays today.  Objective: Vital signs are stable alert and oriented x 3.  Pulses are palpable.  She is great range of motion the first metatarsal phalangeal joint and I can encouraged her to continue with range of motion exercises.  She has mild edema but no significant out of the ordinary problems.  Assessment well-healing surgical foot probable capsulitis second metatarsal phalangeal joint right.  Contusion or broken toe second digit right.  Plan: Discussed etiology pathology and surgical therapies demonstrated to her how to wrap the second toe.  I also recommended for her to continue her range of motion exercises and to follow-up with [place] should the second metatarsophalangeal joint did not resolve.  I did prescribe a methylprednisolone pack that may help take some of the soreness out of that foot so she is going to try that as well.

## 2022-12-29 IMAGING — MG MM DIGITAL SCREENING BILAT W/ TOMO AND CAD
8 series · 9 of 24 positions shown · non-contrast
Comparison: Previous exam(s).

CLINICAL DATA: Screening.

EXAM:
DIGITAL SCREENING BILATERAL MAMMOGRAM WITH TOMOSYNTHESIS AND CAD
TECHNIQUE: Bilateral screening digital craniocaudal and mediolateral oblique
mammograms were obtained. Bilateral screening digital breast
tomosynthesis was performed. The images were evaluated with
computer-aided detection.

[R CC synth-2D]
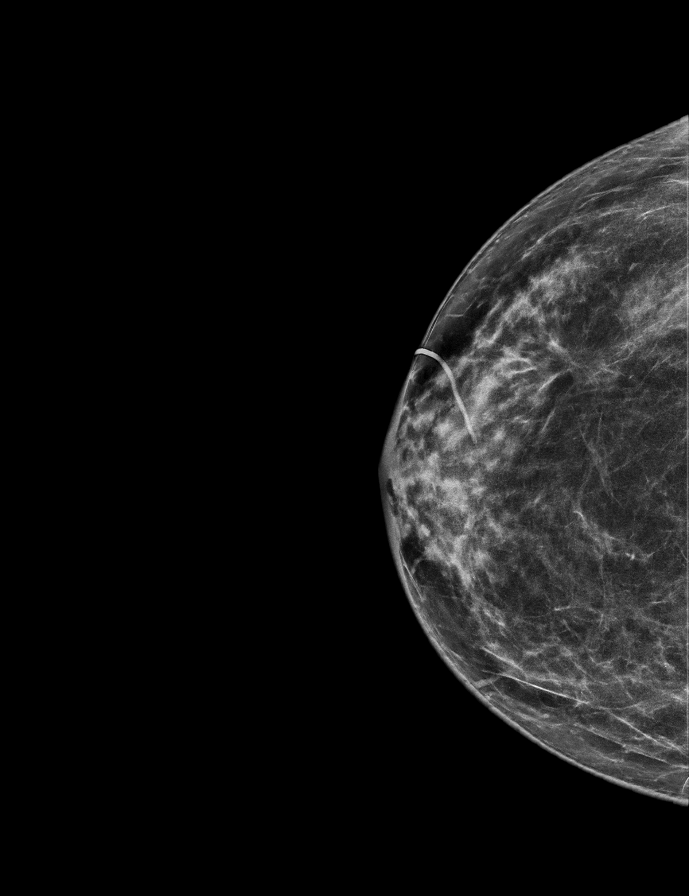

[L MLO synth-2D]
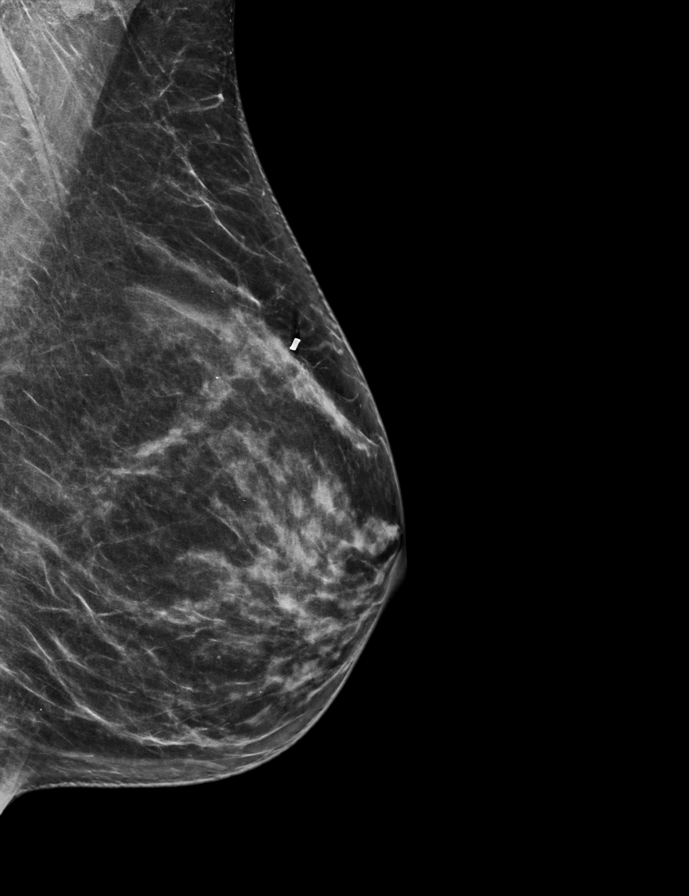

[L CC synth-2D]
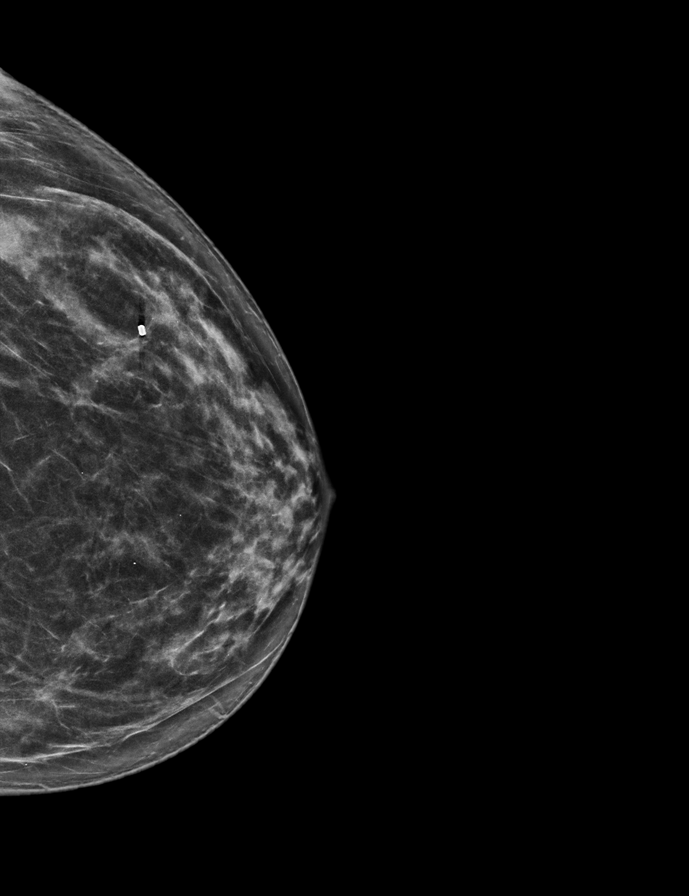

[R MLO synth-2D]
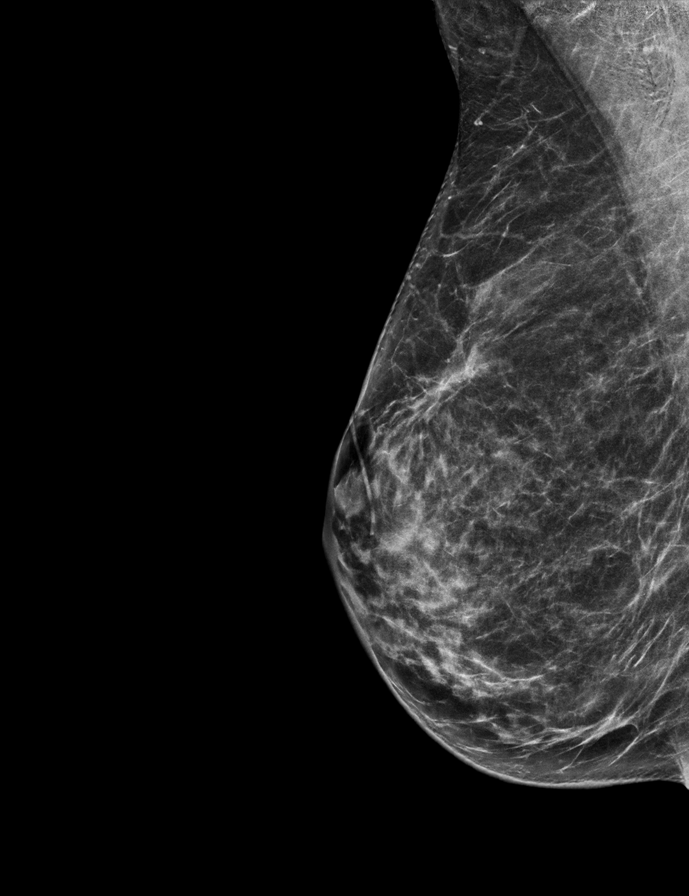

[R MLO tomo · 2 of 67 frames shown]
[frame 22/67]
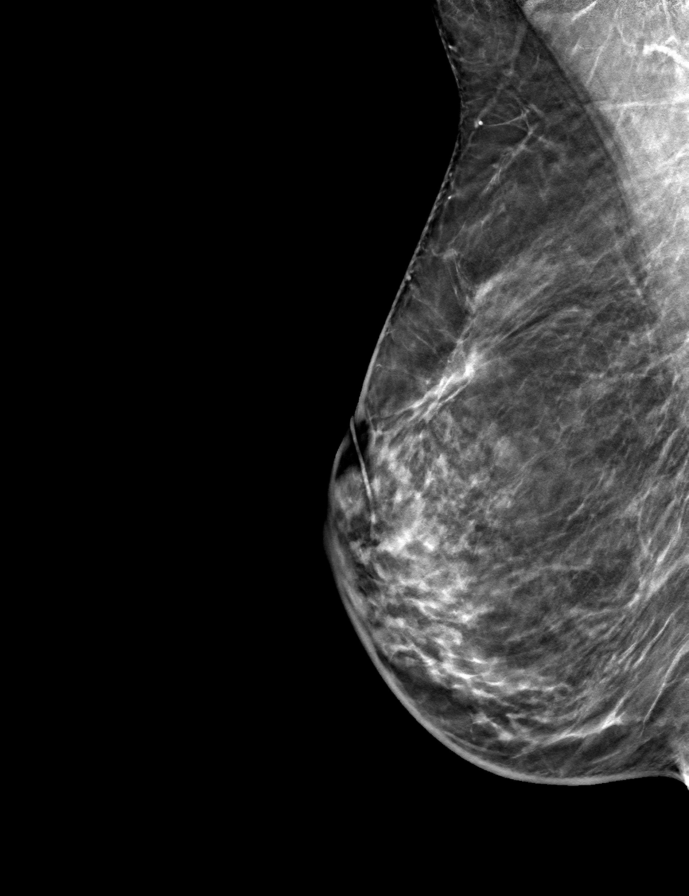
[frame 34/67]
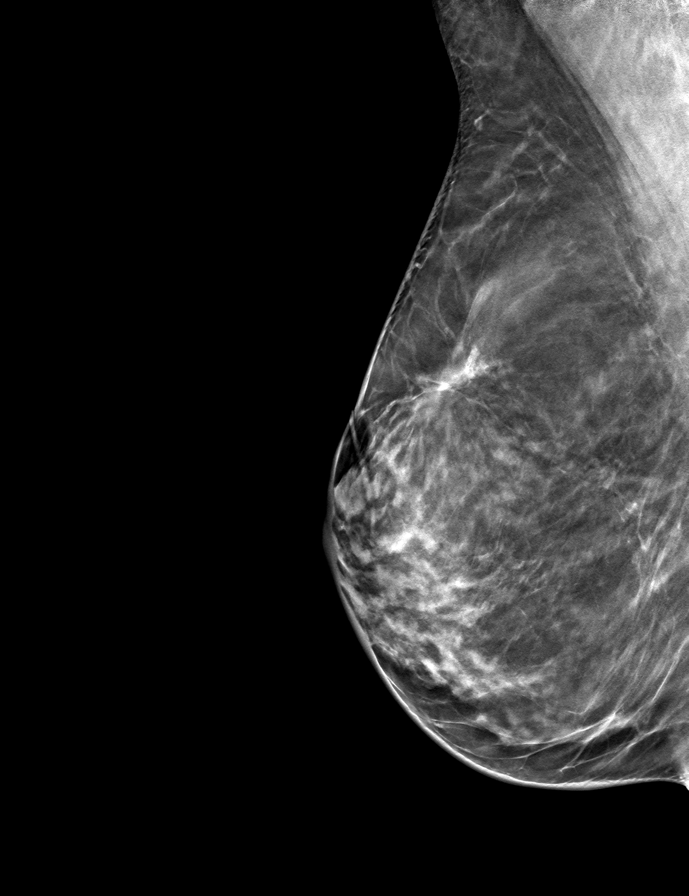

[L CC tomo · tomo slice 34/67.0]
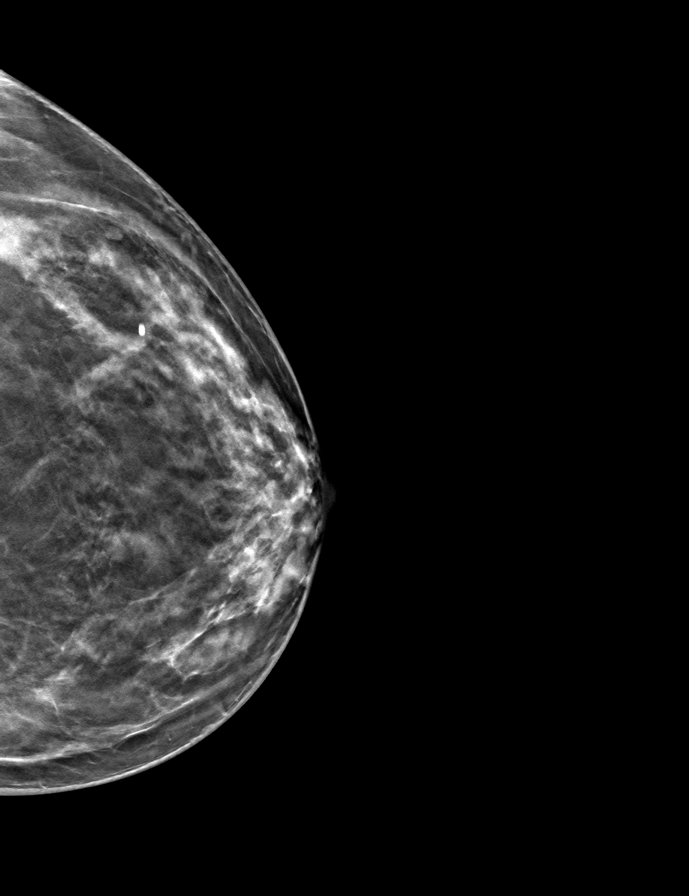

[L MLO tomo · tomo slice 33/65.0]
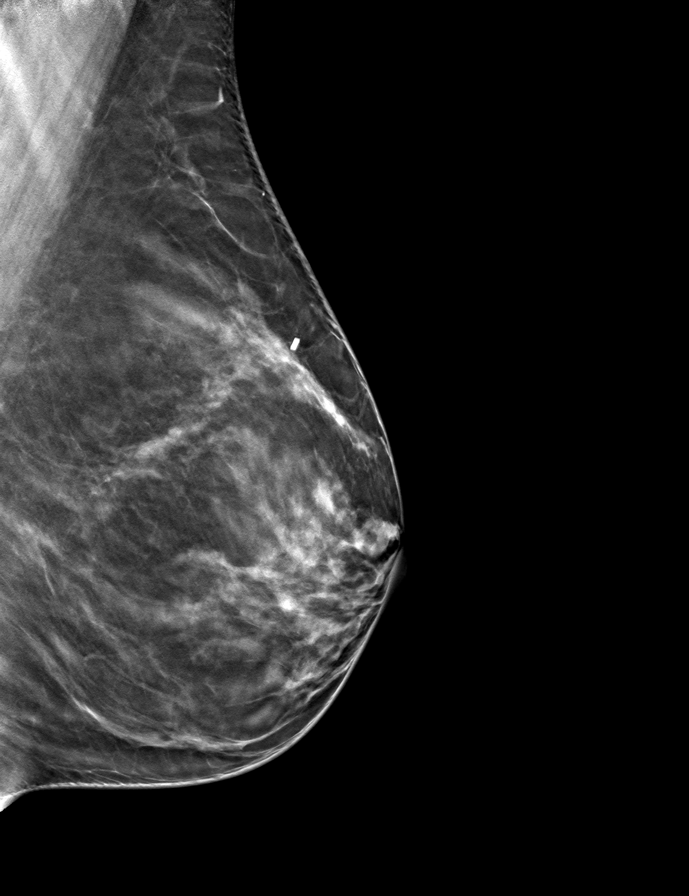

[R CC tomo · tomo slice 31/62.0]
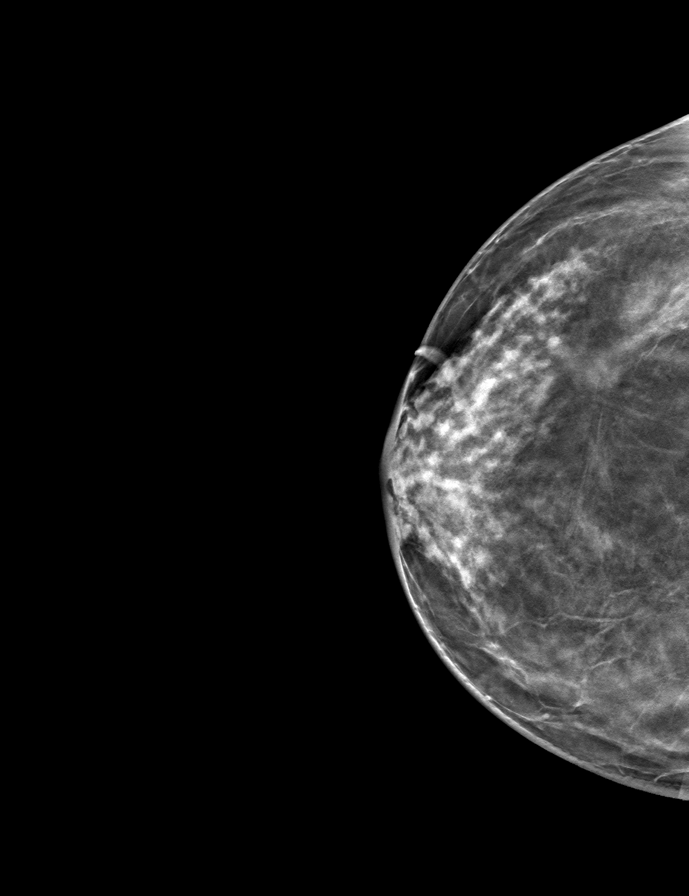

[9 of 24 positions shown; findings below may reference images not displayed]

ACR Breast Density Category c: The breast tissue is heterogeneously
dense, which may obscure small masses.
FINDINGS: There are no findings suspicious for malignancy.
IMPRESSION: No mammographic evidence of malignancy. A result letter of this
screening mammogram will be mailed directly to the patient.

RECOMMENDATION:
Screening mammogram in one year. (Code:[id number])

BI-RADS CATEGORY  1: Negative.

## 2023-05-14 ENCOUNTER — Other Ambulatory Visit: Payer: Self-pay | Admitting: Family Medicine

## 2023-05-14 DIAGNOSIS — E7800 Pure hypercholesterolemia, unspecified: Secondary | ICD-10-CM

## 2023-05-14 DIAGNOSIS — R7309 Other abnormal glucose: Secondary | ICD-10-CM

## 2023-05-14 DIAGNOSIS — Z0000 Encounter for general adult medical examination without abnormal findings: Secondary | ICD-10-CM

## 2023-05-15 ENCOUNTER — Other Ambulatory Visit: Payer: 59

## 2023-05-16 ENCOUNTER — Encounter: Payer: Self-pay | Admitting: Family Medicine

## 2023-05-16 LAB — COMPLETE METABOLIC PANEL WITH GFR
AG Ratio: 2.1 (calc) (ref 1.0–2.5)
ALT: 8 U/L (ref 6–29)
AST: 23 U/L (ref 10–35)
Albumin: 4.6 g/dL (ref 3.6–5.1)
Alkaline phosphatase (APISO): 55 U/L (ref 37–153)
BUN: 15 mg/dL (ref 7–25)
CO2: 29 mmol/L (ref 20–32)
Calcium: 9.3 mg/dL (ref 8.6–10.4)
Chloride: 105 mmol/L (ref 98–110)
Creat: 0.78 mg/dL (ref 0.50–1.05)
Globulin: 2.2 g/dL (ref 1.9–3.7)
Glucose, Bld: 72 mg/dL (ref 65–99)
Potassium: 4.1 mmol/L (ref 3.5–5.3)
Sodium: 142 mmol/L (ref 135–146)
Total Bilirubin: 0.5 mg/dL (ref 0.2–1.2)
Total Protein: 6.8 g/dL (ref 6.1–8.1)
eGFR: 85 mL/min/{1.73_m2} (ref >=60)

## 2023-05-16 LAB — HEMOGLOBIN A1C
Hgb A1c MFr Bld: 5.2 %{Hb} (ref <5.7)
Mean Plasma Glucose: 103 mg/dL
eAG (mmol/L): 5.7 mmol/L

## 2023-05-16 LAB — CBC WITH DIFFERENTIAL/PLATELET
Absolute Lymphocytes: 1250 {cells}/uL (ref 850–3900)
Absolute Monocytes: 440 {cells}/uL (ref 200–950)
Basophils Absolute: 70 {cells}/uL (ref 0–200)
Basophils Relative: 1.4 %
Eosinophils Absolute: 130 {cells}/uL (ref 15–500)
Eosinophils Relative: 2.6 %
HCT: 39.9 % (ref 35.0–45.0)
Hemoglobin: 13.2 g/dL (ref 11.7–15.5)
MCH: 31 pg (ref 27.0–33.0)
MCHC: 33.1 g/dL (ref 32.0–36.0)
MCV: 93.7 fL (ref 80.0–100.0)
MPV: 11.2 fL (ref 7.5–12.5)
Monocytes Relative: 8.8 %
Neutro Abs: 3110 {cells}/uL (ref 1500–7800)
Neutrophils Relative %: 62.2 %
Platelets: 217 10*3/uL (ref 140–400)
RBC: 4.26 10*6/uL (ref 3.80–5.10)
RDW: 12.6 % (ref 11.0–15.0)
Total Lymphocyte: 25 %
WBC: 5 10*3/uL (ref 3.8–10.8)

## 2023-05-16 LAB — LIPID PANEL
Cholesterol: 207 mg/dL — ABNORMAL HIGH (ref <200)
HDL: 68 mg/dL (ref >=50)
LDL Cholesterol (Calc): 119 mg/dL — ABNORMAL HIGH
Non-HDL Cholesterol (Calc): 139 mg/dL — ABNORMAL HIGH (ref <130)
Total CHOL/HDL Ratio: 3 (calc) (ref <5.0)
Triglycerides: 97 mg/dL (ref <150)

## 2023-05-16 LAB — TSH: TSH: 2.91 m[IU]/L (ref 0.40–4.50)

## 2023-05-22 ENCOUNTER — Encounter: Payer: 59 | Admitting: Family Medicine

## 2023-06-05 ENCOUNTER — Encounter: Payer: Self-pay | Admitting: Family Medicine

## 2023-06-05 ENCOUNTER — Other Ambulatory Visit: Payer: Self-pay | Admitting: Family Medicine

## 2023-06-05 ENCOUNTER — Ambulatory Visit: Payer: 59 | Admitting: Family Medicine

## 2023-06-05 VITALS — BP 130/82 | HR 66 | Ht 68.0 in | Wt 180.0 lb

## 2023-06-05 DIAGNOSIS — E7800 Pure hypercholesterolemia, unspecified: Secondary | ICD-10-CM | POA: Diagnosis not present

## 2023-06-05 DIAGNOSIS — Z8249 Family history of ischemic heart disease and other diseases of the circulatory system: Secondary | ICD-10-CM | POA: Diagnosis not present

## 2023-06-05 DIAGNOSIS — Z0000 Encounter for general adult medical examination without abnormal findings: Secondary | ICD-10-CM

## 2023-06-05 DIAGNOSIS — Z1231 Encounter for screening mammogram for malignant neoplasm of breast: Secondary | ICD-10-CM | POA: Diagnosis not present

## 2023-06-05 NOTE — Patient Instructions (Addendum)
Thank you for coming to the office today.  Recommend Prevnar-20 pneumonia vaccine - check at pharmacy.  For Mammogram screening for breast cancer   Call the Imaging Center below anytime to schedule your own appointment now that order has been placed.  [facility] [place] [address], [address] [place], [place] [address] Phone: [phone]  ------------  Keep improving diet naturally. For now no new rx or cholesterol medication.  ----------------  You have been referred for a Coronary Calcium Score Cardiac CT Scan. This is a screening test for patients aged 19-50+ with cardiovascular risk factors or who are healthy but would be interested in Cardiovascular Screening for heart disease. Even if there is a family history of heart disease, this imaging can be useful. Typically it can be done every 5+ years or at a different timeline we agree on  The scan will look at the chest and mainly focus on the heart and identify early signs of calcium build up or blockages within the heart arteries. It is not 100% accurate for identifying blockages or heart disease, but it is useful to help [place] predict who may have some early changes or be at risk in the future for a heart attack or cardiovascular problem.  The results are reviewed by a Cardiologist and they will document the results. It should become available on MyChart. Typically the results are divided into percentiles based on other patients of the same demographic and age. So it will compare your risk to others similar to you. If you have a higher score >99 or higher percentile >75%tile, it is recommended to consider Statin cholesterol therapy and or referral to Cardiologist. I will try to help explain your results and if we have questions we can contact the Cardiologist.  You will be contacted for scheduling. Usually it is done at any imaging facility through [facility], [facility] or [facility].  The cost is $99 flat fee total and it does not go through insurance, so no authorization is required.   Please schedule a Follow-up Appointment to: Return in about 6 months (around 12/03/2023) for 6 month fasting lab > 1 week later Follow-up Cholesterol, updates.  If you have any other questions or concerns, please feel free to call the office or send a message through MyChart. You may also schedule an earlier appointment if necessary.  Additionally, you may be receiving a survey about your experience at our office within a few days to 1 week by e-mail or mail. We value your feedback.  Saralyn Pilar, DO [facility], [place]

## 2023-06-05 NOTE — Progress Notes (Signed)
 Subjective:    Patient ID: Alyssa Bradford, female    DOB: [birth year], 63 y.o.   MRN: [id number]  Alyssa Bradford is a 63 y.o. female presenting on 06/05/2023 for Annual Exam   HPI  Discussed the use of AI scribe software for clinical note transcription with the patient, who gave verbal consent to proceed.  History of Present Illness   Alyssa Bradford is a 63 year old female who presents for an annual physical exam.  She is interested in receiving the updated pneumonia vaccine, as she had history of pneumonia. She recalls receiving an updated pneumonia vaccine approximately 10-12 years ago, but there is no record of it in the current system.  She has a history of elevated cholesterol,  LDL 119. which was noted several years ago while she was on a keto diet. Recently, she has started to change her diet, cutting out red meat and processed foods, and has lost 4.2 pounds in 1 month. She consumes a lot of vegetables and fruits and eats eggs daily. She is not currently on any prescription medications for cholesterol.  She is currently taking over-the-counter medications including melatonin, a sleep aid from [employer], a probiotic, apple cider vinegar pills, and a multivitamin. She notes that without the multivitamin, her iron levels were often too low to donate blood.  Her family history is significant for her father having had stents and open-heart surgery.        Health Maintenance:   UTD COVID / Flu  Updated Shingrix  Due for Prevnar-20 vaccine   Due mammogram 2025. Last done 04/2022   Due for Pap smear cervical cancer screening, prior partial hysterectomy 1994.No further pap smears, previous has been negative. She was asked to f/u with GYN   Colonoscopy screening last done 08/25/19, Dr Servando Snare [facility] - clean without any polyps taken, repeat in 5 years, 08/24/24      08/29/2022    2:02 PM 04/25/2022    1:39 PM 10/19/2020    2:42 PM  Depression screen PHQ 2/9  Decreased Interest 0 0 0  Down, Depressed,  Hopeless 0 0 0  PHQ - 2 Score 0 0 0  Altered sleeping   1  Tired, decreased energy   0  Change in appetite   0  Feeling bad or failure about yourself    0  Trouble concentrating   0  Moving slowly or fidgety/restless   0  Suicidal thoughts   0  PHQ-9 Score   1  Difficult doing work/chores   Not difficult at all       08/29/2022    2:02 PM 10/19/2020    2:43 PM  GAD 7 : Generalized Anxiety Score  Nervous, Anxious, on Edge 0 0  Control/stop worrying 0 0  Worry too much - different things 0 0  Trouble relaxing 0 0  Restless 0 0  Easily annoyed or irritable 0 0  Afraid - awful might happen 0 0  Total GAD 7 Score 0 0  Anxiety Difficulty  Not difficult at all     Past Medical History:  Diagnosis Date   Arthritis    hips and knees   Breast cancer (HCC) 2011   right breast with lumpectomy and rad tx in [place]   Breast cancer, stage 0, 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 Tamoxifen x 3 years, then exemestane. [facility] [place], [place].    COVID-19    tested +  02/15/21.  high fever, cough, fatigue, HA,   Melanoma (HCC)    Motion sickness    Nephrolithiasis    Personal history of radiation therapy    Wears contact lenses    Past Surgical History:  Procedure Laterality Date   ABDOMINAL HYSTERECTOMY  1994   partial - still has cervix, ovaries   BLADDER SUSPENSION     approximately 25 yrs ago   BREAST BIOPSY Left 2001   benign   BREAST EXCISIONAL BIOPSY Right 2011   + with rad tx    BREAST LUMPECTOMY  2011   BREAST SURGERY  2001   BIOPSY   COLONOSCOPY WITH PROPOFOL N/A 07/21/2015   Procedure: COLONOSCOPY WITH PROPOFOL;  Surgeon: Kieth Brightly, MD;  Location: [facility] ENDOSCOPY;  Service: Endoscopy;  Laterality: N/A;   COLONOSCOPY WITH PROPOFOL N/A 08/25/2019   Procedure: COLONOSCOPY WITH PROPOFOL;  Surgeon: Midge Minium, MD;  Location: [facility];  Service: Endoscopy;  Laterality:  N/A;   CYSTOCELE REPAIR N/A 01/22/2018   Procedure: ANTERIOR REPAIR (CYSTOCELE);  Surgeon: Alfredo Martinez, MD;  Location: [facility];  Service: Urology;  Laterality: N/A;   CYSTOSCOPY N/A 01/22/2018   Procedure: CYSTOSCOPY;  Surgeon: Alfredo Martinez, MD;  Location: [facility];  Service: Urology;  Laterality: N/A;   NM PET DX MELANOMA     TONSILLECTOMY     Social History   Socioeconomic History   Marital status: Married    Spouse name: Not on file   Number of children: Not on file   Years of education: Not on file   Highest education level: Not on file  Occupational History   Occupation: retired Psychologist, occupational, Network engineer of business  Tobacco Use   Smoking status: Never   Smokeless tobacco: Never  Vaping Use   Vaping status: Never Used  Substance and Sexual Activity   Alcohol use: Yes    Comment: occasional   Drug use: No   Sexual activity: Not on file  Other Topics Concern   Not on file  Social History Narrative   Not on file   Social Drivers of Health   Financial Resource Strain: Not on file  Food Insecurity: Not on file  Transportation Needs: Not on file  Physical Activity: Not on file  Stress: Not on file  Social Connections: Not on file  Intimate Partner Violence: Not on file   Family History  Problem Relation Age of Onset   Cancer Father        BLADDER /SKIN    Heart disease Father        Stent placement & CABG x 3   Heart attack Father 39   Breast cancer Maternal Grandmother        70's   Breast cancer Paternal Grandmother        60's   Current Outpatient Medications on File Prior to Visit  Medication Sig   APPLE CIDER VINEGAR PO Take by mouth.   doxylamine, Sleep, (UNISOM) 25 MG tablet Take 1 tablet (25 mg total) by mouth at bedtime as needed.   Melatonin 10 MG TABS Take 1 tablet by mouth daily as needed.   Multiple Vitamin (MULTIVITAMIN) tablet Take 1 tablet by mouth daily.   No current facility-administered medications on file prior to visit.     Review of Systems  Constitutional:  Negative for activity change, appetite change, chills, diaphoresis, fatigue and fever.  HENT:  Negative for congestion and hearing loss.   Eyes:  Negative for visual disturbance.  Respiratory:  Negative for cough, chest tightness, shortness of breath and wheezing.   Cardiovascular:  Negative for chest pain, palpitations and leg swelling.  Gastrointestinal:  Negative for abdominal pain, constipation, diarrhea, nausea and vomiting.  Genitourinary:  Negative for dysuria, frequency and hematuria.  Musculoskeletal:  Negative for arthralgias and neck pain.  Skin:  Negative for rash.  Neurological:  Negative for dizziness, weakness, light-headedness, numbness and headaches.  Hematological:  Negative for adenopathy.  Psychiatric/Behavioral:  Negative for behavioral problems, dysphoric mood and sleep disturbance.    Per HPI unless specifically indicated above     Objective:    BP 130/82   Pulse 66   Ht 5\' 8"  (1.727 m)   Wt 180 lb (81.6 kg)   BMI 27.37 kg/m   Wt Readings from Last 3 Encounters:  06/05/23 180 lb (81.6 kg)  08/29/22 175 lb (79.4 kg)  04/25/22 174 lb 9.6 oz (79.2 kg)    Physical Exam Vitals and nursing note reviewed.  Constitutional:      General: She is not in acute distress.    Appearance: She is well-developed. She is not diaphoretic.     Comments: Well-appearing, comfortable, cooperative  HENT:     Head: Normocephalic and atraumatic.  Eyes:     General:        Right eye: No discharge.        Left eye: No discharge.     Conjunctiva/sclera: Conjunctivae normal.     Pupils: Pupils are equal, round, and reactive to light.  Neck:     Thyroid: No thyromegaly.     Vascular: No carotid bruit.  Cardiovascular:     Rate and Rhythm: Normal rate and regular rhythm.     Pulses: Normal pulses.     Heart sounds: Normal heart sounds. No murmur heard. Pulmonary:     Effort: Pulmonary effort is normal. No respiratory distress.      Breath sounds: Normal breath sounds. No wheezing or rales.  Abdominal:     General: Bowel sounds are normal. There is no distension.     Palpations: Abdomen is soft. There is no mass.     Tenderness: There is no abdominal tenderness.  Musculoskeletal:        General: No tenderness. Normal range of motion.     Cervical back: Normal range of motion and neck supple.     Right lower leg: No edema.     Left lower leg: No edema.     Comments: Upper / Lower Extremities: - Normal muscle tone, strength bilateral upper extremities 5/5, lower extremities 5/5  Lymphadenopathy:     Cervical: No cervical adenopathy.  Skin:    General: Skin is warm and dry.     Findings: No erythema or rash.  Neurological:     Mental Status: She is alert and oriented to person, place, and time.     Comments: Distal sensation intact to light touch all extremities  Psychiatric:        Mood and Affect: Mood normal.        Behavior: Behavior normal.        Thought Content: Thought content normal.     Comments: Well groomed, good eye contact, normal speech and thoughts     Results for orders placed or performed in visit on 05/14/23  TSH   Collection Time: 05/15/23  9:06 AM  Result Value Ref Range   TSH 2.91 0.40 - 4.50 mIU/L  Lipid panel   Collection Time: 05/15/23  9:06 AM  Result Value Ref Range   Cholesterol 207 (H) <200 mg/dL   HDL 68 > OR = 50 mg/dL   Triglycerides 97 <914 mg/dL   LDL Cholesterol (Calc) 119 (H) mg/dL (calc)   Total CHOL/HDL Ratio 3.0 <5.0 (calc)   Non-HDL Cholesterol (Calc) 139 (H) <130 mg/dL (calc)  Hemoglobin N8G   Collection Time: 05/15/23  9:06 AM  Result Value Ref Range   Hgb A1c MFr Bld 5.2 <5.7 % of total Hgb   Mean Plasma Glucose 103 mg/dL   eAG (mmol/L) 5.7 mmol/L  COMPLETE METABOLIC PANEL WITH GFR   Collection Time: 05/15/23  9:06 AM  Result Value Ref Range   Glucose, Bld 72 65 - 99 mg/dL   BUN 15 7 - 25 mg/dL   Creat 9.56 2.13 - 0.86 mg/dL   eGFR 85 > OR = 60  VH/QIO/9.62X5   BUN/Creatinine Ratio SEE NOTE: 6 - 22 (calc)   Sodium 142 135 - 146 mmol/L   Potassium 4.1 3.5 - 5.3 mmol/L   Chloride 105 98 - 110 mmol/L   CO2 29 20 - 32 mmol/L   Calcium 9.3 8.6 - 10.4 mg/dL   Total Protein 6.8 6.1 - 8.1 g/dL   Albumin 4.6 3.6 - 5.1 g/dL   Globulin 2.2 1.9 - 3.7 g/dL (calc)   AG Ratio 2.1 1.0 - 2.5 (calc)   Total Bilirubin 0.5 0.2 - 1.2 mg/dL   Alkaline phosphatase (APISO) 55 37 - 153 U/L   AST 23 10 - 35 U/L   ALT 8 6 - 29 U/L  CBC with Differential/Platelet   Collection Time: 05/15/23  9:06 AM  Result Value Ref Range   WBC 5.0 3.8 - 10.8 Thousand/uL   RBC 4.26 3.80 - 5.10 Million/uL   Hemoglobin 13.2 11.7 - 15.5 g/dL   HCT 28.4 13.2 - 44.0 %   MCV 93.7 80.0 - 100.0 fL   MCH 31.0 27.0 - 33.0 pg   MCHC 33.1 32.0 - 36.0 g/dL   RDW 10.2 72.5 - 36.6 %   Platelets 217 140 - 400 Thousand/uL   MPV 11.2 7.5 - 12.5 fL   Neutro Abs 3,110 1,500 - 7,800 cells/uL   Absolute Lymphocytes 1,250 850 - 3,900 cells/uL   Absolute Monocytes 440 200 - 950 cells/uL   Eosinophils Absolute 130 15 - 500 cells/uL   Basophils Absolute 70 0 - 200 cells/uL   Neutrophils Relative % 62.2 %   Total Lymphocyte 25.0 %   Monocytes Relative 8.8 %   Eosinophils Relative 2.6 %   Basophils Relative 1.4 %      Assessment & Plan:   Problem List Items Addressed This Visit   None Visit Diagnoses       Annual physical exam    -  Primary     Encounter for screening mammogram for malignant neoplasm of breast       Relevant Orders   MM 3D SCREENING MAMMOGRAM BILATERAL BREAST     Elevated LDL cholesterol level       Relevant Orders   CT CARDIAC SCORING (SELF PAY ONLY)     Family history of heart disease       Relevant Orders   CT CARDIAC SCORING (SELF PAY ONLY)        Updated Health Maintenance information Reviewed recent lab results with patient Encouraged improvement to lifestyle with diet and exercise Goal of weight loss   Hyperlipidemia LDL cholesterol  slightly elevated at 119. Patient has initiated  lifestyle modifications including diet changes and weight loss. Discussed the option of a heart CT scan due to family history of heart disease. -Order heart CT scan for calcium score -Check lipid panel in 6 months (August 2025).  Vaccinations History of pneumonia vaccination but not documented in current records. Patient interested in receiving another dose prior to age 50 -Check with pharmacy for cost and availability of Prevnar 20.  General Health Maintenance Order for Mammo - patient to schedule mammogram at [facility] (last done January 2024). -Colonoscopy due in 2026 (last done 2021 at [facility]).        Orders Placed This Encounter  Procedures   MM 3D SCREENING MAMMOGRAM BILATERAL BREAST    Standing Status:   Future    Expiration Date:   06/04/2024    Reason for Exam (SYMPTOM  OR DIAGNOSIS REQUIRED):   Screening bilateral 3D Mammogram Tomo    Preferred imaging location?:   [facility]   CT CARDIAC SCORING (SELF PAY ONLY)    Standing Status:   Future    Expiration Date:   06/04/2024    Preferred imaging location?:   [facility]    No orders of the defined types were placed in this encounter.    Follow up plan: Return in about 6 months (around 12/03/2023) for 6 month fasting lab > 1 week later Follow-up Cholesterol, updates.  Future Lipid panel 6 months 12/03/23   Saralyn Pilar, DO [facility] 06/05/2023, 1:35 PM

## 2023-06-08 ENCOUNTER — Ambulatory Visit: Payer: Self-pay

## 2023-06-08 NOTE — Telephone Encounter (Signed)
Message from [place] C sent at 06/08/2023 12:15 PM EST  Summary: UTI   Pt is calling with a uti. She is visiting in [place] and was wondering if the dr would call her in some medication. Please advise, please and thank you.         Chief Complaint: burning with urination Symptoms: same Frequency: yesterday Pertinent Negatives: Patient denies fever, back pain Disposition: [] ED /[x] Urgent Care (no appt availability in office) / [] Appointment(In office/virtual)/ []  [facility]/ [] Home Care/ [] Refused Recommended Disposition /[] [facility]/ []  Follow-up with PCP Additional Notes: pt is in [place]  Reason for Disposition  Age > 50 years  Answer Assessment - Initial Assessment Questions 1. SEVERITY: "How bad is the pain?"  (e.g., Scale 1-10; mild, moderate, or severe)   - MILD (1-3): complains slightly about urination hurting   - MODERATE (4-7): interferes with normal activities     - SEVERE (8-10): excruciating, unwilling or unable to urinate because of the pain      severe 2. ONSET: "When did the painful urination start?"      yesterday 5. FEVER: "Do you have a fever?" If Yes, ask: "What is your temperature, how was it measured, and when did it start?"     no  7. CAUSE: "What do you think is causing the painful urination?"  (e.g., UTI, scratch, Herpes sore)     UTI 8. OTHER SYMPTOMS: "Do you have any other symptoms?" (e.g., blood in urine, flank pain, genital sores, urgency, vaginal discharge)     no  Protocols used: Urination Pain - Female-A-AH

## 2023-06-20 ENCOUNTER — Ambulatory Visit
Admission: RE | Admit: 2023-06-20 | Discharge: 2023-06-20 | Disposition: A | Discharge status: Home / Self Care | Payer: Self-pay | Source: Ambulatory Visit | Attending: Family Medicine | Admitting: Family Medicine

## 2023-06-20 DIAGNOSIS — Z8249 Family history of ischemic heart disease and other diseases of the circulatory system: Secondary | ICD-10-CM

## 2023-06-20 DIAGNOSIS — E7800 Pure hypercholesterolemia, unspecified: Secondary | ICD-10-CM

## 2023-06-21 ENCOUNTER — Encounter: Payer: Self-pay | Admitting: Family Medicine

## 2023-06-27 ENCOUNTER — Ambulatory Visit
Admission: RE | Admit: 2023-06-27 | Discharge: 2023-06-27 | Disposition: A | Discharge status: Home / Self Care | Source: Ambulatory Visit | Attending: Family Medicine | Admitting: Family Medicine

## 2023-06-27 DIAGNOSIS — Z1231 Encounter for screening mammogram for malignant neoplasm of breast: Secondary | ICD-10-CM | POA: Insufficient documentation

## 2023-07-23 ENCOUNTER — Telehealth: Payer: Self-pay

## 2023-07-23 NOTE — Telephone Encounter (Signed)
 Copied from CRM [id number]. Topic: Clinical - Medical Advice >> Jul 23, 2023  1:32 PM Carlatta H wrote: Reason for CRM: patient is on day 3 of Covid and is not feeling any better//She would like a call back from the nurse in regards to possible having something else

## 2023-07-24 ENCOUNTER — Telehealth (INDEPENDENT_AMBULATORY_CARE_PROVIDER_SITE_OTHER): Admitting: Family Medicine

## 2023-07-24 DIAGNOSIS — R110 Nausea: Secondary | ICD-10-CM

## 2023-07-24 DIAGNOSIS — U071 COVID-19: Secondary | ICD-10-CM

## 2023-07-24 DIAGNOSIS — R194 Change in bowel habit: Secondary | ICD-10-CM

## 2023-07-24 DIAGNOSIS — R519 Headache, unspecified: Secondary | ICD-10-CM

## 2023-07-24 MED ORDER — ONDANSETRON 4 MG PO TBDP: 4.0000 mg | ORAL_TABLET | Freq: Three times a day (TID) | ORAL | 0 refills | Status: AC | PRN

## 2023-07-24 NOTE — Progress Notes (Signed)
 Subjective:    Patient ID: Alyssa Bradford, female    DOB: 1961/02/16, 63 y.o.   MRN: [id number]  Alyssa Bradford is a 63 y.o. female presenting on 07/24/2023 for Covid Positive (Headache, Fatigue, Abdominal Pain/Cramping)   Virtual / Telehealth Encounter - Video Visit via MyChart The purpose of this virtual visit is to provide medical care while limiting exposure to the novel coronavirus (COVID19) for both patient and office staff.  Consent was obtained for remote visit:  Yes.   Answered questions that patient had about telehealth interaction:  Yes.   I discussed the limitations, risks, security and privacy concerns of performing an evaluation and management service by video/telephone. I also discussed with the patient that there may be a patient responsible charge related to this service. The patient expressed understanding and agreed to proceed.  Patient Location: Home Provider Location: [facility] (Office)  Participants in virtual visit: - Patient: Alyssa Bradford - CMA: Fuller Plan CMA - Provider: Dr Althea Charon   HPI  Discussed the use of AI scribe software for clinical note transcription with the patient, who gave verbal consent to proceed.  History of Present Illness   Alyssa Bradford is a 63 year old female who presents with persistent symptoms following a positive COVID-19 test.  She tested positive for COVID-19 on July 16, 2023, after returning from a trip to [place] to see the [employer]. Symptoms began on July 13, 2023, with chills, upset stomach, and severe headaches. Upon returning home, she had a fever of 101.58F. Since the onset of symptoms, she has experienced persistent fatigue and frequent bowel movements. Headaches have improved as of today, and night sweats, which lasted for five nights, have resolved.  She describes frequent bowel movements, with up to seven episodes in one day, but denies diarrhea. For symptom relief, she initially took Motrin for headaches  but has since stopped. She has been using ginger ale for stomach upset and has tried mints, which she has been advised to avoid due to potential exacerbation of symptoms. She has not been taking any specific medication for her stomach issues.  She describes the fatigue as feeling exhausted. No specific treatment for fatigue has been initiated. She monitors her blood pressure and oxygen levels at home, reporting a blood pressure of 125/71 mmHg and oxygen saturation levels of 93-94%.  No respiratory symptoms such as cough or congestion. She notes an increase in her breaths per minute and resting heart rate over the past eight days.            08/29/2022    2:02 PM 04/25/2022    1:39 PM 10/19/2020    2:42 PM  Depression screen PHQ 2/9  Decreased Interest 0 0 0  Down, Depressed, Hopeless 0 0 0  PHQ - 2 Score 0 0 0  Altered sleeping   1  Tired, decreased energy   0  Change in appetite   0  Feeling bad or failure about yourself    0  Trouble concentrating   0  Moving slowly or fidgety/restless   0  Suicidal thoughts   0  PHQ-9 Score   1  Difficult doing work/chores   Not difficult at all       08/29/2022    2:02 PM 10/19/2020    2:43 PM  GAD 7 : Generalized Anxiety Score  Nervous, Anxious, on Edge 0 0  Control/stop worrying 0 0  Worry too much - different things 0 0  Trouble relaxing  0 0  Restless 0 0  Easily annoyed or irritable 0 0  Afraid - awful might happen 0 0  Total GAD 7 Score 0 0  Anxiety Difficulty  Not difficult at all    Social History   Tobacco Use   Smoking status: Never   Smokeless tobacco: Never  Vaping Use   Vaping status: Never Used  Substance Use Topics   Alcohol use: Yes    Comment: occasional   Drug use: No    Review of Systems Per HPI unless specifically indicated above     Objective:    There were no vitals taken for this visit.  Wt Readings from Last 3 Encounters:  06/05/23 180 lb (81.6 kg)  08/29/22 175 lb (79.4 kg)  04/25/22 174 lb 9.6  oz (79.2 kg)     Physical Exam  Note examination was completely remotely via video observation objective data only  Gen - well-appearing, no acute distress or apparent pain, comfortable HEENT - eyes appear clear without discharge or redness Heart/Lungs - cannot examine virtually - observed no evidence of coughing or labored breathing. Abd - cannot examine virtually  Skin - face visible today- no rash Neuro - awake, alert, oriented Psych - not anxious appearing   Results for orders placed or performed in visit on 05/14/23  TSH   Collection Time: 05/15/23  9:06 AM  Result Value Ref Range   TSH 2.91 0.40 - 4.50 mIU/L  Lipid panel   Collection Time: 05/15/23  9:06 AM  Result Value Ref Range   Cholesterol 207 (H) <200 mg/dL   HDL 68 > OR = 50 mg/dL   Triglycerides 97 <161 mg/dL   LDL Cholesterol (Calc) 119 (H) mg/dL (calc)   Total CHOL/HDL Ratio 3.0 <5.0 (calc)   Non-HDL Cholesterol (Calc) 139 (H) <130 mg/dL (calc)  Hemoglobin W9U   Collection Time: 05/15/23  9:06 AM  Result Value Ref Range   Hgb A1c MFr Bld 5.2 <5.7 % of total Hgb   Mean Plasma Glucose 103 mg/dL   eAG (mmol/L) 5.7 mmol/L  COMPLETE METABOLIC PANEL WITH GFR   Collection Time: 05/15/23  9:06 AM  Result Value Ref Range   Glucose, Bld 72 65 - 99 mg/dL   BUN 15 7 - 25 mg/dL   Creat 0.45 4.09 - 8.11 mg/dL   eGFR 85 > OR = 60 BJ/YNW/2.95A2   BUN/Creatinine Ratio SEE NOTE: 6 - 22 (calc)   Sodium 142 135 - 146 mmol/L   Potassium 4.1 3.5 - 5.3 mmol/L   Chloride 105 98 - 110 mmol/L   CO2 29 20 - 32 mmol/L   Calcium 9.3 8.6 - 10.4 mg/dL   Total Protein 6.8 6.1 - 8.1 g/dL   Albumin 4.6 3.6 - 5.1 g/dL   Globulin 2.2 1.9 - 3.7 g/dL (calc)   AG Ratio 2.1 1.0 - 2.5 (calc)   Total Bilirubin 0.5 0.2 - 1.2 mg/dL   Alkaline phosphatase (APISO) 55 37 - 153 U/L   AST 23 10 - 35 U/L   ALT 8 6 - 29 U/L  CBC with Differential/Platelet   Collection Time: 05/15/23  9:06 AM  Result Value Ref Range   WBC 5.0 3.8 - 10.8  Thousand/uL   RBC 4.26 3.80 - 5.10 Million/uL   Hemoglobin 13.2 11.7 - 15.5 g/dL   HCT 13.0 86.5 - 78.4 %   MCV 93.7 80.0 - 100.0 fL   MCH 31.0 27.0 - 33.0 pg   MCHC 33.1 32.0 -  36.0 g/dL   RDW 16.1 09.6 - 04.5 %   Platelets 217 140 - 400 Thousand/uL   MPV 11.2 7.5 - 12.5 fL   Neutro Abs 3,110 1,500 - 7,800 cells/uL   Absolute Lymphocytes 1,250 850 - 3,900 cells/uL   Absolute Monocytes 440 200 - 950 cells/uL   Eosinophils Absolute 130 15 - 500 cells/uL   Basophils Absolute 70 0 - 200 cells/uL   Neutrophils Relative % 62.2 %   Total Lymphocyte 25.0 %   Monocytes Relative 8.8 %   Eosinophils Relative 2.6 %   Basophils Relative 1.4 %      Assessment & Plan:   Problem List Items Addressed This Visit   None Visit Diagnoses       COVID-19 virus infection    -  Primary     Nausea       Relevant Medications   ondansetron (ZOFRAN-ODT) 4 MG disintegrating tablet     Increased bowel frequency         Acute nonintractable headache, unspecified headache type           COVID-19 infection vs Stomach Flu GI Viral Syndrome  Positive COVID-19 infection, initially presented with chills, gastrointestinal upset, severe headaches, and fever.  Onset symptoms 3/21, tested positive 3/24 Still febrile in PM, but now nearly resolved without anti-pyretics  Symptoms have evolved to include fatigue, nausea, and frequent bowel movements. Headaches have improved, and there is no respiratory involvement. Not high-risk, and the timeline for antiviral treatment has passed.   Focus is on symptom management, particularly for gastrointestinal symptoms and fatigue. Symptom relief is the main goal, with emphasis on improving nutrition and hydration.  Encouraged hydration and nutrition to combat fatigue. Suggested B vitamins and vitamin D supplementation for fatigue. - Prescribe Zofran for nausea - Recommend OTC peppermint oil capsules (IB Guard) for gastrointestinal symptoms - Advise against hard candies  and mints due to potential for triggering loose stools - Discuss the use of Imodium for severe diarrhea symptoms - Encourage hydration and nutrition to combat fatigue - Suggest B vitamins and vitamin D supplementation for fatigue  Blood pressure is well-controlled with recent measurements of 132/70 and 125/71. Oxygen saturation is at 93-94%.  Follow-up Advised to monitor symptoms and contact the provider if gastrointestinal symptoms do not improve with peppermint oil or if further intervention is needed. Discussed the option of prescription strength dicyclomine (Bentyl) if symptoms persist. - Follow up in 1-2 days if symptoms persist or worsen        No orders of the defined types were placed in this encounter.   Meds ordered this encounter  Medications   ondansetron (ZOFRAN-ODT) 4 MG disintegrating tablet    Sig: Take 1 tablet (4 mg total) by mouth every 8 (eight) hours as needed for nausea or vomiting.    Dispense:  30 tablet    Refill:  0    Follow up plan: Return if symptoms worsen or fail to improve.   Patient verbalizes understanding with the above medical recommendations including the limitation of remote medical advice.  Specific follow-up and call-back criteria were given for patient to follow-up or seek medical care more urgently if needed.  Total duration of direct patient care provided via video conference: 8 minutes   Saralyn Pilar, DO [facility] 07/24/2023, 1:51 PM

## 2023-07-24 NOTE — Patient Instructions (Addendum)
 Thank you for coming to the office today.  Zofran dissolving tab for nausea  Try this option IBGard OTC Peppermint Oil (Triple Coated Capsule) 180mg  take one 3 times daily to reduce diarrhea  Contact me back within 1-2 days if want to try Rx strength for abdominal symptoms digestion- best for cramping, urgency loose stools etc - Dicyclomine (Bentyl)   Please schedule a Follow-up Appointment to: Return if symptoms worsen or fail to improve.  If you have any other questions or concerns, please feel free to call the office or send a message through MyChart. You may also schedule an earlier appointment if necessary.  Additionally, you may be receiving a survey about your experience at our office within a few days to 1 week by e-mail or mail. We value your feedback.  Saralyn Pilar, DO [facility], [place]

## 2023-07-26 ENCOUNTER — Encounter: Payer: Self-pay | Admitting: Family Medicine

## 2023-07-26 DIAGNOSIS — K529 Noninfective gastroenteritis and colitis, unspecified: Secondary | ICD-10-CM

## 2023-07-26 DIAGNOSIS — R109 Unspecified abdominal pain: Secondary | ICD-10-CM

## 2023-07-26 MED ORDER — DICYCLOMINE HCL 10 MG PO CAPS
10.0000 mg | ORAL_CAPSULE | Freq: Three times a day (TID) | ORAL | 0 refills | Status: AC
Start: 2023-07-26 — End: ?

## 2023-07-26 MED ORDER — AMOXICILLIN-POT CLAVULANATE 875-125 MG PO TABS: 1.0000 | ORAL_TABLET | Freq: Two times a day (BID) | ORAL | 0 refills | Status: AC

## 2023-08-02 ENCOUNTER — Other Ambulatory Visit: Payer: Self-pay | Admitting: Family Medicine

## 2023-08-02 DIAGNOSIS — R109 Unspecified abdominal pain: Secondary | ICD-10-CM

## 2023-08-02 DIAGNOSIS — K529 Noninfective gastroenteritis and colitis, unspecified: Secondary | ICD-10-CM

## 2023-08-03 NOTE — Telephone Encounter (Signed)
 Too soon for refill.  Requested Prescriptions  Pending Prescriptions Disp Refills   dicyclomine (BENTYL) 10 MG capsule [Pharmacy Med Name: DICYCLOMINE 10 MG CAPSULE] 360 capsule 1    Sig: TAKE 1 CAP BY MOUTH 4 TIMES DAILY BEFORE MEALS AND AT BEDTIME AS NEEDED FOR ABDOMINAL CRAMPING PAIN     Gastroenterology:  Antispasmodic Agents Passed - 08/03/2023 11:30 AM      Passed - Valid encounter within last 12 months    Recent Outpatient Visits           1 week ago COVID-19 virus infection   [place] [facility], Netta Neat, DO   1 month ago Annual physical exam   [place] [facility] Smitty Cords, DO       Future Appointments             In 4 months Althea Charon, Netta Neat, DO [facility], [facility]

## 2023-11-27 ENCOUNTER — Other Ambulatory Visit

## 2023-11-27 DIAGNOSIS — E7800 Pure hypercholesterolemia, unspecified: Secondary | ICD-10-CM

## 2023-11-28 LAB — LIPID PANEL
Cholesterol: 208 mg/dL — ABNORMAL HIGH (ref <200)
HDL: 50 mg/dL (ref >=50)
LDL Cholesterol (Calc): 125 mg/dL — ABNORMAL HIGH
Non-HDL Cholesterol (Calc): 158 mg/dL — ABNORMAL HIGH (ref <130)
Total CHOL/HDL Ratio: 4.2 (calc) (ref <5.0)
Triglycerides: 213 mg/dL — ABNORMAL HIGH (ref <150)

## 2023-11-30 ENCOUNTER — Ambulatory Visit: Payer: Self-pay | Admitting: Family Medicine

## 2023-12-03 ENCOUNTER — Other Ambulatory Visit: Payer: 59

## 2023-12-10 ENCOUNTER — Ambulatory Visit: Payer: 59 | Admitting: Family Medicine

## 2023-12-14 ENCOUNTER — Encounter: Payer: Self-pay | Admitting: Family Medicine

## 2023-12-14 ENCOUNTER — Ambulatory Visit: Admitting: Family Medicine

## 2023-12-14 VITALS — BP 122/72 | HR 74 | Ht 68.0 in | Wt 179.5 lb

## 2023-12-14 DIAGNOSIS — M2550 Pain in unspecified joint: Secondary | ICD-10-CM | POA: Diagnosis not present

## 2023-12-14 DIAGNOSIS — S30861A Insect bite (nonvenomous) of abdominal wall, initial encounter: Secondary | ICD-10-CM | POA: Diagnosis not present

## 2023-12-14 DIAGNOSIS — W57XXXA Bitten or stung by nonvenomous insect and other nonvenomous arthropods, initial encounter: Secondary | ICD-10-CM | POA: Diagnosis not present

## 2023-12-14 MED ORDER — PREDNISONE 20 MG PO TABS: ORAL_TABLET | ORAL | 0 refills | Status: AC

## 2023-12-14 MED ORDER — DOXYCYCLINE HYCLATE 100 MG PO TABS: 100.0000 mg | ORAL_TABLET | Freq: Two times a day (BID) | ORAL | 0 refills | Status: AC

## 2023-12-14 NOTE — Patient Instructions (Signed)
   Please schedule a Follow-up Appointment to: No follow-ups on file.  If you have any other questions or concerns, please feel free to call the office or send a message through MyChart. You may also schedule an earlier appointment if necessary.  Additionally, you may be receiving a survey about your experience at our office within a few days to 1 week by e-mail or mail. We value your feedback.  Saralyn Pilar, DO [facility], [place]

## 2023-12-14 NOTE — Progress Notes (Signed)
 Subjective:    Patient ID: Alyssa Bradford, female    DOB: 1960/12/20, 63 y.o.   MRN: [id number]  Alyssa Bradford is a 63 y.o. female presenting on 12/14/2023 for Insect Bite  Patient presents for a same day appointment.  HPI  Discussed the use of AI scribe software for clinical note transcription with the patient, who gave verbal consent to proceed.  History of Present Illness   Alyssa Bradford is a 63 year old female who presents with joint pain and a possible insect bite.  Polyarticular arthralgia and stiffness - Significant joint pain and stiffness since Monday - Involvement of hips, knees, and toe joints - Pain severe enough to disrupt sleep - Requires two to three Motrin at night for relief  Pruritic cutaneous lesion - Approximately two weeks ago, developed a very itchy, raised lesion - Lesion had a blister-like appearance and was broken open - Currently a small scabby spot remains - Concern for possible insect or tick bite - Concern for Lyme disease due to associated symptoms  Additionally - Recent onset of stiff neck and headache - Symptoms prompted concern for tick-borne illness  - Experiencing hot flashes despite being well past menopause - No fever present      08/29/2022    2:02 PM 04/25/2022    1:39 PM 10/19/2020    2:42 PM  Depression screen PHQ 2/9  Decreased Interest 0 0 0  Down, Depressed, Hopeless 0 0 0  PHQ - 2 Score 0 0 0  Altered sleeping   1  Tired, decreased energy   0  Change in appetite   0  Feeling bad or failure about yourself    0  Trouble concentrating   0  Moving slowly or fidgety/restless   0  Suicidal thoughts   0  PHQ-9 Score   1  Difficult doing work/chores   Not difficult at all       08/29/2022    2:02 PM 10/19/2020    2:43 PM  GAD 7 : Generalized Anxiety Score  Nervous, Anxious, on Edge 0 0  Control/stop worrying 0 0  Worry too much - different things 0 0  Trouble relaxing 0 0  Restless 0 0  Easily annoyed or irritable 0 0  Afraid - awful  might happen 0 0  Total GAD 7 Score 0 0  Anxiety Difficulty  Not difficult at all    Social History   Tobacco Use   Smoking status: Never   Smokeless tobacco: Never  Vaping Use   Vaping status: Never Used  Substance Use Topics   Alcohol use: Yes    Comment: occasional   Drug use: No    Review of Systems Per HPI unless specifically indicated above     Objective:    BP 122/72   Pulse 74   Ht 5' 8 (1.727 m)   Wt 179 lb 8 oz (81.4 kg)   SpO2 96%   BMI 27.29 kg/m   Wt Readings from Last 3 Encounters:  12/14/23 179 lb 8 oz (81.4 kg)  06/05/23 180 lb (81.6 kg)  08/29/22 175 lb (79.4 kg)    Physical Exam Vitals and nursing note reviewed.  Constitutional:      General: She is not in acute distress.    Appearance: Normal appearance. She is well-developed. She is not diaphoretic.     Comments: Well-appearing, comfortable, cooperative  HENT:     Head: Normocephalic and atraumatic.  Eyes:     General:  Right eye: No discharge.        Left eye: No discharge.     Conjunctiva/sclera: Conjunctivae normal.  Cardiovascular:     Rate and Rhythm: Normal rate.  Pulmonary:     Effort: Pulmonary effort is normal.  Skin:    General: Skin is warm and dry.     Findings: Lesion (see photo, L flank , resolving insect bite. has scab. no erythema migrans or spreading rash.) present. No erythema or rash.  Neurological:     Mental Status: She is alert and oriented to person, place, and time.  Psychiatric:        Mood and Affect: Mood normal.        Behavior: Behavior normal.        Thought Content: Thought content normal.     Comments: Well groomed, good eye contact, normal speech and thoughts       Results for orders placed or performed in visit on 11/27/23  Lipid panel   Collection Time: 11/27/23  9:33 AM  Result Value Ref Range   Cholesterol 208 (H) <200 mg/dL   HDL 50 > OR = 50 mg/dL   Triglycerides 786 (H) <150 mg/dL   LDL Cholesterol (Calc) 125 (H) mg/dL (calc)    Total CHOL/HDL Ratio 4.2 <5.0 (calc)   Non-HDL Cholesterol (Calc) 158 (H) <130 mg/dL (calc)      Assessment & Plan:   Problem List Items Addressed This Visit   None Visit Diagnoses       Pain in joint involving multiple sites    -  Primary   Relevant Medications   predniSONE  (DELTASONE ) 20 MG tablet   Other Relevant Orders   B. burgdorfi antibodies     Tick bite of left flank, initial encounter       Relevant Medications   doxycycline  (VIBRA -TABS) 100 MG tablet   Other Relevant Orders   B. burgdorfi antibodies   CBC with Differential/Platelet   Comprehensive metabolic panel with GFR        Suspected tick-borne illness (Lyme disease) with systemic inflammatory response and polyarthralgia Possible Lyme disease or other tick borne illness due to systemic symptoms and recent tick exposure, despite absence of bullseye rash.  There is some uncertainty with diagnosis given constellation of symptoms may other arthritic etiology or inflammatory response.  - Order Lyme disease titer and blood tests including chemistry and blood count. - Prescribe doxycycline  for 14 days. Consider extend to 21 day course pending response and lab result. - If not improving rx sent for  a 7-day taper of prednisone  to manage systemic inflammatory response - Advised on doxycycline  administration: take with full glass of water , remain upright for 30 minutes, avoid iron, calcium, and vitamins within 2 hours of dosing.  Localized skin lesion after insect or tick bite, left side Small pustule with red raised area, no classic bullseye rash observed.  No secondary infection or other abnormality identified       Orders Placed This Encounter  Procedures   B. burgdorfi antibodies   CBC with Differential/Platelet   Comprehensive metabolic panel with GFR    Meds ordered this encounter  Medications   doxycycline  (VIBRA -TABS) 100 MG tablet    Sig: Take 1 tablet (100 mg total) by mouth 2 (two) times daily.  For 14 days. Take with full glass of water , stay upright 30 min after taking.    Dispense:  28 tablet    Refill:  0   predniSONE  (DELTASONE ) 20 MG tablet  Sig: Take daily with food. Start with 60mg  (3 pills) x 2 days, then reduce to 40mg  (2 pills) x 2 days, then 20mg  (1 pill) x 3 days    Dispense:  13 tablet    Refill:  0    Follow up plan: Return if symptoms worsen or fail to improve.  Marsa Officer, DO [facility] 12/14/2023, 11:39 AM

## 2023-12-16 LAB — CBC WITH DIFFERENTIAL/PLATELET
Absolute Lymphocytes: 1648 {cells}/uL (ref 850–3900)
Absolute Monocytes: 515 {cells}/uL (ref 200–950)
Basophils Absolute: 62 {cells}/uL (ref 0–200)
Basophils Relative: 1.2 %
Eosinophils Absolute: 88 {cells}/uL (ref 15–500)
Eosinophils Relative: 1.7 %
HCT: 38.2 % (ref 35.0–45.0)
Hemoglobin: 12.9 g/dL (ref 11.7–15.5)
MCH: 31.1 pg (ref 27.0–33.0)
MCHC: 33.8 g/dL (ref 32.0–36.0)
MCV: 92 fL (ref 80.0–100.0)
MPV: 10.6 fL (ref 7.5–12.5)
Monocytes Relative: 9.9 %
Neutro Abs: 2886 {cells}/uL (ref 1500–7800)
Neutrophils Relative %: 55.5 %
Platelets: 238 Thousand/uL (ref 140–400)
RBC: 4.15 Million/uL (ref 3.80–5.10)
RDW: 12.3 % (ref 11.0–15.0)
Total Lymphocyte: 31.7 %
WBC: 5.2 Thousand/uL (ref 3.8–10.8)

## 2023-12-16 LAB — COMPREHENSIVE METABOLIC PANEL WITH GFR
AG Ratio: 2 (calc) (ref 1.0–2.5)
ALT: 13 U/L (ref 6–29)
AST: 28 U/L (ref 10–35)
Albumin: 4.7 g/dL (ref 3.6–5.1)
Alkaline phosphatase (APISO): 61 U/L (ref 37–153)
BUN: 18 mg/dL (ref 7–25)
CO2: 27 mmol/L (ref 20–32)
Calcium: 9.7 mg/dL (ref 8.6–10.4)
Chloride: 102 mmol/L (ref 98–110)
Creat: 0.71 mg/dL (ref 0.50–1.05)
Globulin: 2.3 g/dL (ref 1.9–3.7)
Glucose, Bld: 92 mg/dL (ref 65–99)
Potassium: 4.2 mmol/L (ref 3.5–5.3)
Sodium: 139 mmol/L (ref 135–146)
Total Bilirubin: 0.6 mg/dL (ref 0.2–1.2)
Total Protein: 7 g/dL (ref 6.1–8.1)
eGFR: 95 mL/min/1.73m2 (ref >=60)

## 2023-12-16 LAB — B. BURGDORFI ANTIBODIES: B burgdorferi Ab IgG+IgM: <0.9 {index}

## 2023-12-19 ENCOUNTER — Ambulatory Visit: Payer: Self-pay | Admitting: Family Medicine

## 2024-03-10 ENCOUNTER — Ambulatory Visit: Admitting: Podiatry

## 2024-04-30 ENCOUNTER — Ambulatory Visit

## 2024-04-30 ENCOUNTER — Ambulatory Visit: Admitting: Podiatry

## 2024-04-30 DIAGNOSIS — M778 Other enthesopathies, not elsewhere classified: Secondary | ICD-10-CM | POA: Diagnosis not present

## 2024-04-30 DIAGNOSIS — M19071 Primary osteoarthritis, right ankle and foot: Secondary | ICD-10-CM

## 2024-04-30 DIAGNOSIS — M19079 Primary osteoarthritis, unspecified ankle and foot: Secondary | ICD-10-CM

## 2024-04-30 MED ORDER — TRIAMCINOLONE ACETONIDE 40 MG/ML IJ SUSP
20.0000 mg | Freq: Once | INTRAMUSCULAR | Status: AC
  Administered 2024-04-30: 20 mg

## 2024-04-30 NOTE — Progress Notes (Signed)
 She presents today after of having her first metatarsophalangeal joint right foot replaced.  She says she is very happy with that however she has pain beneath the second metatarsal phalangeal joint of the right foot and states that the toe seems to be longer and she keeps bumping it on everything.  States that the majority of the symptoms are beneath the second metatarsal phalangeal joint.  Objective: Vital signs stable alert oriented x 3 great range of motion of the first metatarsophalangeal joint.  She does have pain on palpation and end range of motion of the second metatarsal phalangeal joint of the right foot.  Assessment: Plantarflexed second metatarsal elongated second metatarsal with capsulitis second metatarsal phalangeal joint right foot.  Plan: Discussed etiology pathology and surgical therapies at this point injected her periarticular lead with 10 mg of Kenalog  and local anesthetic.  Tolerated the procedure well without complications.  Discussed appropriate shoe gear.  We discussed the need for surgical intervention which we will have to do in May after she gets back from all of her traveling particularly [place]  x 2 weeks.
